# Patient Record
Sex: Female | Born: 1960 | Race: White | Hispanic: No | Marital: Married | Smoking: Former smoker
Health system: Southern US, Community
[De-identification: ages and names within clinical notes are randomized; demographics above are authoritative.]

## PROBLEM LIST (undated history)

## (undated) DIAGNOSIS — F419 Anxiety disorder, unspecified: Secondary | ICD-10-CM

## (undated) HISTORY — PX: TONSILLECTOMY: SUR1361

## (undated) HISTORY — PX: DILATION AND CURETTAGE OF UTERUS: SHX78

---

## 2011-01-11 LAB — HM COLONOSCOPY

## 2013-11-11 ENCOUNTER — Other Ambulatory Visit: Payer: Self-pay

## 2013-11-11 DIAGNOSIS — Z1231 Encounter for screening mammogram for malignant neoplasm of breast: Secondary | ICD-10-CM

## 2013-11-13 ENCOUNTER — Ambulatory Visit: Payer: Self-pay

## 2013-11-13 ENCOUNTER — Ambulatory Visit
Admission: RE | Admit: 2013-11-13 | Discharge: 2013-11-13 | Disposition: A | Discharge status: Home / Self Care | Payer: Managed Care, Other (non HMO) | Source: Ambulatory Visit

## 2013-11-13 DIAGNOSIS — Z1231 Encounter for screening mammogram for malignant neoplasm of breast: Secondary | ICD-10-CM

## 2013-11-17 ENCOUNTER — Ambulatory Visit: Payer: Self-pay

## 2013-11-24 ENCOUNTER — Other Ambulatory Visit: Payer: Self-pay | Admitting: Family Medicine

## 2013-11-24 DIAGNOSIS — R928 Other abnormal and inconclusive findings on diagnostic imaging of breast: Secondary | ICD-10-CM

## 2013-12-03 ENCOUNTER — Ambulatory Visit
Admission: RE | Admit: 2013-12-03 | Discharge: 2013-12-03 | Disposition: A | Discharge status: Home / Self Care | Payer: Managed Care, Other (non HMO) | Source: Ambulatory Visit | Attending: Family Medicine | Admitting: Family Medicine

## 2013-12-03 DIAGNOSIS — R928 Other abnormal and inconclusive findings on diagnostic imaging of breast: Secondary | ICD-10-CM

## 2015-07-20 ENCOUNTER — Other Ambulatory Visit (HOSPITAL_COMMUNITY)
Admission: RE | Admit: 2015-07-20 | Discharge: 2015-07-20 | Disposition: A | Discharge status: Home / Self Care | Payer: BC Managed Care – PPO | Source: Ambulatory Visit | Attending: Family Medicine | Admitting: Family Medicine

## 2015-07-20 ENCOUNTER — Other Ambulatory Visit: Payer: Self-pay | Admitting: Family Medicine

## 2015-07-20 DIAGNOSIS — Z124 Encounter for screening for malignant neoplasm of cervix: Secondary | ICD-10-CM | POA: Insufficient documentation

## 2015-07-20 LAB — LIPID PANEL
Cholesterol: 206 — AB (ref 0–200)
HDL: 66 (ref 35–70)
LDL CALC: 126
LDL/HDL RATIO: 3.1
TRIGLYCERIDES: 70 (ref 40–160)

## 2015-07-20 LAB — HEMOGLOBIN A1C: Hemoglobin A1C: 5.6

## 2015-07-20 LAB — BASIC METABOLIC PANEL: GLUCOSE: 92

## 2015-07-22 LAB — CYTOLOGY - PAP

## 2015-08-11 ENCOUNTER — Other Ambulatory Visit: Payer: Self-pay

## 2015-08-11 DIAGNOSIS — Z1231 Encounter for screening mammogram for malignant neoplasm of breast: Secondary | ICD-10-CM

## 2015-11-08 ENCOUNTER — Ambulatory Visit: Payer: BC Managed Care – PPO

## 2015-11-09 ENCOUNTER — Ambulatory Visit
Admission: RE | Admit: 2015-11-09 | Discharge: 2015-11-09 | Disposition: A | Discharge status: Home / Self Care | Payer: BC Managed Care – PPO | Source: Ambulatory Visit

## 2015-11-09 DIAGNOSIS — Z1231 Encounter for screening mammogram for malignant neoplasm of breast: Secondary | ICD-10-CM

## 2015-11-14 ENCOUNTER — Other Ambulatory Visit: Payer: Self-pay | Admitting: Family Medicine

## 2015-11-14 DIAGNOSIS — R928 Other abnormal and inconclusive findings on diagnostic imaging of breast: Secondary | ICD-10-CM

## 2015-11-25 ENCOUNTER — Ambulatory Visit
Admission: RE | Admit: 2015-11-25 | Discharge: 2015-11-25 | Disposition: A | Discharge status: Home / Self Care | Payer: BC Managed Care – PPO | Source: Ambulatory Visit | Attending: Family Medicine | Admitting: Family Medicine

## 2015-11-25 DIAGNOSIS — R928 Other abnormal and inconclusive findings on diagnostic imaging of breast: Secondary | ICD-10-CM

## 2016-10-15 ENCOUNTER — Encounter (HOSPITAL_COMMUNITY): Payer: Self-pay | Admitting: *Deleted

## 2016-10-15 ENCOUNTER — Inpatient Hospital Stay (HOSPITAL_COMMUNITY)
Admission: AD | Admit: 2016-10-15 | Discharge: 2016-10-15 | Disposition: A | Discharge status: Home / Self Care | Payer: BC Managed Care – PPO | Source: Ambulatory Visit | Attending: Obstetrics and Gynecology | Admitting: Obstetrics and Gynecology

## 2016-10-15 DIAGNOSIS — N888 Other specified noninflammatory disorders of cervix uteri: Secondary | ICD-10-CM | POA: Diagnosis not present

## 2016-10-15 DIAGNOSIS — Z880 Allergy status to penicillin: Secondary | ICD-10-CM | POA: Diagnosis not present

## 2016-10-15 DIAGNOSIS — N99820 Postprocedural hemorrhage and hematoma of a genitourinary system organ or structure following a genitourinary system procedure: Secondary | ICD-10-CM | POA: Diagnosis present

## 2016-10-15 LAB — CBC
HCT: 38.3 % (ref 36.0–46.0)
Hemoglobin: 13.2 g/dL (ref 12.0–15.0)
MCH: 30.1 pg (ref 26.0–34.0)
MCHC: 34.5 g/dL (ref 30.0–36.0)
MCV: 87.2 fL (ref 78.0–100.0)
PLATELETS: 236 10*3/uL (ref 150–400)
RBC: 4.39 MIL/uL (ref 3.87–5.11)
RDW: 12.8 % (ref 11.5–15.5)
WBC: 6.2 10*3/uL (ref 4.0–10.5)

## 2016-10-15 LAB — TYPE AND SCREEN
ABO/RH(D): A POS
Antibody Screen: NEGATIVE

## 2016-10-15 LAB — ABO/RH: ABO/RH(D): A POS

## 2016-10-15 MED ORDER — MEGESTROL ACETATE 40 MG PO TABS
80.0000 mg | ORAL_TABLET | Freq: Once | ORAL | Status: AC
  Administered 2016-10-15: 80 mg via ORAL
  Filled 2016-10-15: qty 2

## 2016-10-15 MED ORDER — MEGESTROL ACETATE 40 MG PO TABS: 80.0000 mg | ORAL_TABLET | Freq: Two times a day (BID) | ORAL | 1 refills | Status: DC

## 2016-10-15 NOTE — MAU Note (Signed)
Pt here with heavy vaginal bleeding after D&C on 7/10.

## 2016-10-15 NOTE — Discharge Instructions (Signed)
Abnormal Uterine Bleeding Abnormal uterine bleeding can affect women at various stages in life, including teenagers, women in their reproductive years, pregnant women, and women who have reached menopause. Several kinds of uterine bleeding are considered abnormal, including:  Bleeding or spotting between periods.  Bleeding after sexual intercourse.  Bleeding that is heavier or more than normal.  Periods that last longer than usual.  Bleeding after menopause. Many cases of abnormal uterine bleeding are minor and simple to treat, while others are more serious. Any type of abnormal bleeding should be evaluated by your health care provider. Treatment will depend on the cause of the bleeding. Follow these instructions at home: Monitor your condition for any changes. The following actions may help to alleviate any discomfort you are experiencing:  Avoid the use of tampons and douches as directed by your health care provider.  Change your pads frequently. You should get regular pelvic exams and Pap tests. Keep all follow-up appointments for diagnostic tests as directed by your health care provider. Contact a health care provider if:  Your bleeding lasts more than 1 week.  You feel dizzy at times. Get help right away if:  You pass out.  You are changing pads every 15 to 30 minutes.  You have abdominal pain.  You have a fever.  You become sweaty or weak.  You are passing large blood clots from the vagina.  You start to feel nauseous and vomit. This information is not intended to replace advice given to you by your health care provider. Make sure you discuss any questions you have with your health care provider. Document Released: 03/19/2005 Document Revised: 08/31/2015 Document Reviewed: 10/16/2012 Elsevier Interactive Patient Education  2017 [employer]vier Inc.  

## 2016-10-15 NOTE — MAU Provider Note (Signed)
Chief Complaint: Vaginal Bleeding   First Provider Initiated Contact with Patient 10/15/16 0731     SUBJECTIVE HPI: Alexandra Bridges is a 56 y.o. non-pregnant female 6 days post-op D&C for polyps 10/09/16 who presents to [facility] reporting Increasing bleeding cine the procedure that because heavy this morning soaking a large overnight pad upon standing up.   Associated signs and symptoms: Neg for fever, chills abd pain, dizziness.   Taking Ibuprofen.   No past medical history on file. OB History  No data available   Past Surgical History:  Procedure Laterality Date  . TONSILLECTOMY     Social History   Social History  . Marital status: Married    Spouse name: N/A  . Number of children: N/A  . Years of education: N/A   Occupational History  . Not on file.   Social History Main Topics  . Smoking status: Not on file  . Smokeless tobacco: Not on file  . Alcohol use Not on file  . Drug use: Unknown  . Sexual activity: Not on file   Other Topics Concern  . Not on file   Social History Narrative  . No narrative on file   Family History  Problem Relation Age of Onset  . Cancer Mother   . Hypertension Mother   . Cancer Sister   . Hypertension Sister   . Cancer Brother   . Hypertension Brother    No current facility-administered medications on file prior to encounter.    No current outpatient prescriptions on file prior to encounter.   Allergies  Allergen Reactions  . Penicillins Rash    I have reviewed patient's Past Medical Hx, Surgical Hx, Family Hx, Social Hx, medications and allergies.   Review of Systems  Constitutional: Negative for chills and fever.  Gastrointestinal: Negative for abdominal pain, diarrhea and vomiting.  Genitourinary: Positive for vaginal bleeding. Negative for hematuria and vaginal pain.  Neurological: Negative for dizziness.    OBJECTIVE Patient Vitals for the past 24 hrs:  BP Temp Temp src Pulse Resp SpO2 Height Weight   10/15/16 0648 - - - - - - 5\' 7"  (1.702 m) -  10/15/16 1540 (!) 148/67 98.3 F (36.8 C) Oral 77 18 99 % 5\' 7"  (1.702 m) 163 lb (73.9 kg)   Constitutional: Well-developed, well-nourished female in no acute distress.  Skin: No Pallor Cardiovascular: normal rate Respiratory: normal rate and effort.  GI: Abd soft, non-tender Neurologic: Alert and oriented x 4.  GU:  SPECULUM EXAM: NEFG, moderate amount of bright red blood noted--enough to soak 4 Fox swabs. Cervix closed, oozing from anterior lip from ? Tenaculum sight. Scant oozing from os. Cervical bleeding stopped w/ Silver nitrate.   BIMANUAL: cervix closed; uterus normal size, no adnexal tenderness or masses. No CMT.  LAB RESULTS Results for orders placed or performed during the hospital encounter of 10/15/16 (from the past 24 hour(s))  CBC     Status: None   Collection Time: 10/15/16  7:08 AM  Result Value Ref Range   WBC 6.2 4.0 - 10.5 K/uL   RBC 4.39 3.87 - 5.11 MIL/uL   Hemoglobin 13.2 12.0 - 15.0 g/dL   HCT 38.3 36.0 - 46.0 %   MCV 87.2 78.0 - 100.0 fL   MCH 30.1 26.0 - 34.0 pg   MCHC 34.5 30.0 - 36.0 g/dL   RDW 12.8 11.5 - 15.5 %   Platelets 236 150 - 400 K/uL  Type and screen     Status:  None   Collection Time: 10/15/16  7:16 AM  Result Value Ref Range   ABO/RH(D) A POS    Antibody Screen NEG    Sample Expiration 10/18/2016     IMAGING No results found.  MAU COURSE Orders Placed This Encounter  Procedures  . CBC  . Type and screen  . ABO/Rh  . Discharge patient   Small amount of active bleeding w/ ambulation.    Meds ordered this encounter  Medications  . megestrol (MEGACE) tablet 80 mg  . megestrol (MEGACE) 40 MG tablet    Sig: Take 2 tablets (80 mg total) by mouth 2 (two) times daily.    Dispense:  60 tablet    Refill:  1    Order Specific Question:   Supervising Provider    Answer:   Florian Buff [[id number]]    MDM Discussed Hx, labs, exam w/ Dr. Julien Girt. Agrees w/ POC. New orders: Megace 80 mg BID.  First dose in MAU. Call office to see Dr. Julien Girt in am.   ASSESSMENT 1. Postoperative vaginal bleeding following genitourinary procedure   2. Bleeding of cervix     PLAN Discharge home in stable condition. Bleeding precautions Follow-up Information    Marylynn Pearson, MD Follow up.   Specialty:  Obstetrics and Gynecology Why:  Call office tomorrow to scheduled appointment  Contact information: [address], [address] [place] [address] [phone]        [facility] ADMISS[facility] .   Contact information: [address] [id number] [place] [phone]         Allergies as of 10/15/2016      Reactions   Penicillins Rash      Medication List    TAKE these medications   megestrol 40 MG tablet Commonly known as:  MEGACE Take 2 tablets (80 mg total) by mouth 2 (two) times daily.        Tamala Julian, [place], [place] 10/15/2016  8:37 AM

## 2016-10-16 ENCOUNTER — Other Ambulatory Visit: Payer: Self-pay | Admitting: Obstetrics and Gynecology

## 2016-10-16 DIAGNOSIS — Z1231 Encounter for screening mammogram for malignant neoplasm of breast: Secondary | ICD-10-CM

## 2016-10-18 ENCOUNTER — Encounter: Payer: Self-pay | Admitting: Gynecologic Oncology

## 2016-10-19 ENCOUNTER — Telehealth: Payer: Self-pay | Admitting: Gynecologic Oncology

## 2016-10-19 NOTE — Telephone Encounter (Signed)
Pt rescheduled appt with Dr. Alycia Rossetti to 7/25 at 145pm.

## 2016-10-24 ENCOUNTER — Encounter: Payer: Self-pay | Admitting: Gynecologic Oncology

## 2016-10-24 ENCOUNTER — Ambulatory Visit: Payer: BC Managed Care – PPO | Admitting: Gynecologic Oncology

## 2016-10-24 ENCOUNTER — Ambulatory Visit: Payer: BC Managed Care – PPO | Attending: Gynecologic Oncology | Admitting: Gynecologic Oncology

## 2016-10-24 DIAGNOSIS — N840 Polyp of corpus uteri: Secondary | ICD-10-CM | POA: Insufficient documentation

## 2016-10-24 DIAGNOSIS — Z8049 Family history of malignant neoplasm of other genital organs: Secondary | ICD-10-CM | POA: Insufficient documentation

## 2016-10-24 DIAGNOSIS — N8502 Endometrial intraepithelial neoplasia [EIN]: Secondary | ICD-10-CM | POA: Diagnosis not present

## 2016-10-24 NOTE — Progress Notes (Signed)
Consult Note: Gyn-Onc  Alexandra Bridges 56 y.o. female  CC:  Chief Complaint  Patient presents with  . Complex endometrial hyperplasia with atypia    HPI: Patient is seen today in consultation at the request of Dr. Gretchen Adkins. Primary care physician Dr. Elaine Griffith.  Patient is a 56-year-old gravida 3 para 3 who was seen by Dr. Adkins and underwent an ultrasound on 08/08/2016. It revealed the uterus to be 10.7 x 5.3 x 6.8 cm normal appearing adnexa. However, there was a thickened inhomogeneous myometrium with no definitive fibroids they raise the question of possible adenomyosis. There is a possible density seen within the endometrial cavity. She underwent a post saline ultrasound revealed a 19 x 9 mm polyp appearing mass seen on the posterior endometrial wall. On 10/09/2016 she underwent a hysteroscopy D&C. 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 [facility] of heavy bleeding. She was placed on Megace and subsequently has been referred to us.  Her bleeding has improved on the Megace. Her cycles prior to this were very regular. She moved from [place] and saw Dr. Adkins is a new patient in May. These bleeding irregularities started in January. She did have an FSH, which is consistent with her being premenopausal. She's had 3 vaginal deliveries. Her mammogram is due in August. She had a colonoscopy gauge of 50 with follow-up in 10 years. Should a son who had a colonoscopy is 20 and had many polyps removed. Sig allergy to penicillin however she does not believe she had a penicillin allergy. She was taking penicillin developed a rash she was also having her kitchen remodeled and believes that most of that secondary to environmental exposures are not penicillin itself.  Review of Systems: Constitutional: Denies fever. Skin: No rash Cardiovascular: No chest pain,  shortness of breath, or edema  Gastro Intestinal: No nausea, vomiting, constipation, or diarrhea reported.  Genitourinary: No frequency, urgency, or dysuria. +vaginal bleeding and discharge.  Psychology: No complaints. Is somewhat worried about the timing of all this. She is a 3-year-old granddaughter and is expecting a new grandson on August 12. She also has a trip to [place] plan for September 10  Current Meds:  Outpatient Encounter Prescriptions as of 10/24/2016  Medication Sig  . cetirizine (ZYRTEC) 10 MG tablet Take 10 mg by mouth daily as needed for allergies.  . megestrol (MEGACE) 40 MG tablet Take 2 tablets (80 mg total) by mouth 2 (two) times daily.   No facility-administered encounter medications on file as of 10/24/2016.     Allergy:  Allergies  Allergen Reactions  . Penicillins Rash    Social Hx:   Social History   Social History  . Marital status: Married    Spouse name: N/A  . Number of children: N/A  . Years of education: N/A   Occupational History  . Not on file.   Social History Main Topics  . Smoking status: Never Smoker  . Smokeless tobacco: Never Used  . Alcohol use 1.2 oz/week    2 Glasses of wine per week  . Drug use: No  . Sexual activity: Yes   Other Topics Concern  . Not on file   Social History Narrative  . No narrative on file    Past Surgical Hx:  Past Surgical History:  Procedure Laterality Date  . TONSILLECTOMY      Past Medical Hx: History reviewed. No pertinent   past medical history.  Oncology Hx:   No history exists.    Family Hx:  Family History  Problem Relation Age of Onset  . Hypertension Mother   . Cancer Father        multiple myeloma  . Cancer Sister 64       endometrial and renal cell  . Hypertension Sister   . Cancer Brother 55       prostate  . Hypertension Brother   . Cancer Maternal Grandmother        pancreatic  . Cancer Maternal Grandfather        esophageal    Vitals:  Blood pressure (!) 113/54,  pulse 85, temperature 98.5 F (36.9 C), temperature source Oral, resp. rate 18, height 5' 7" (1.702 m), weight 164 lb 1.6 oz (74.4 kg), SpO2 100 %.  Physical Exam:  Well-nourished well-developed female in no acute distress.  Neck: Supple, no lymphadenopathy, no thyromegaly.  Lungs: Clear to auscultation bilaterally  Cardiac: Regular rate and rhythm  Abdomen: Soft, nontender, nondistended. There are no palpable mass or prostatomegaly.  Groins: No lymphadenopathy.  Extremities: No edema  Pelvic: External genitalia within normal limits. Vagina is slightly atrophic. There is evidence of menstrual flow. The cervix is multiparous. There are no gross visible lesions. Bimanual examination the uterus is mid plane. It is of normal size shape and consistency. There are no adnexal masses. It is freely mobile.  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 A detailed discussion was held with the patient and her husband with regard to to her diagnosis. We discussed the standard management options for CAH which includes surgery and depending on the results of surgery adjuvant therapy if there is a deeply invasive endometrial cancer. We discussed that while she has complex atypical hyperplasia and we currently do not know if she carries a diagnosis and surgery will be indicated to both the diagnosis and treat the issue.  The options for surgical management include a hysterectomy and removal of the tubes and ovaries possibly with removal of sentinel lymph nodes. A minimally invasive approach including a robotic hysterectomy or laparoscopic hysterectomy have benefits including shorter hospital stay, recovery time and better wound healing. The patient has been counseled about these surgical options and the risks of surgery in general including infection, bleeding, damage to surrounding structures (including bowel, bladder, ureters, nerves or vessels), and the postoperative risks of PE/  DVT, and lymphedema. I discussed positioning during surgery of trendelenberg and risks of minor facial swelling and care we take in preoperative positioning. After counseling and consideration of her options, she desires to proceed with robotic hysterectomy bilateral salpingo-oophorectomy and sentinel lymph node dissection as indicated.  She understands that if she does not map on her sentinel lymph node will proceed with a hysterectomy and sent it for frozen section. Only if there is deeply invasive cancer within proceed with a complete lymphadenectomy. Her sister had endometrial cancer and had 20 lymph nodes removed so she is familiar with this as well as the neuropathy in the genitofemoral region that her sister also did experience postoperatively.  Her family history is fairly worrisome. Her sister is going to undergo testing for Lynch syndrome. I discussed with the patient that if she has endometrial cancer on final pathology that I would recommend she undergo Lynch testing as well. However, if she does not have endometrial cancer I would test her tumor for mismatch repair proteins and MSI. That would then dictate the need for her to undergo   genetic testing. Obviously, if her sister is positive for Lynch syndrome the patient will undergo testing for the same mutation that her sister exhibits. This was explained in detail to the patient and her husband and they're very comfortable with this staged approach to genetic testing as they are with a staged approach 2 sentinel lymph nodes and only complete lymphadenectomy should frozen section revealed deeply invasive cancer.  The patient does have a grandchild that is expected on August 12 as well as a trip to [place] for September 10. She would like to schedule surgery for August 7 with Dr. Everitt Amber. She states that she does not have to be present for the birth of her grandchild that she would like to make the trip to [place] as planned with her friends.  I discussed with her that if we are able to accomplish in a minimally invasive fashion I feel that she would most likely be able to make this trip and I would recommend that she take a full aspirin during her long flights and for a few days after to decrease the risk of VTE. She will continue her Megace until the time of surgery to decrease the risk of heavy bleeding.  We appreciate the opportunity to partner in the care of this very pleasant patient.  GEHRIG,PAOLA A., MD 10/24/2016, 2:48 PM

## 2016-10-24 NOTE — Patient Instructions (Signed)
Preparing for your Surgery  Plan for surgery on November 06, 2016 with Dr. Everitt Amber at [facility] will be scheduled for a robotic assisted total hysterectomy, bilateral salpingo-oophorectomy, sentinel lymph node biopsy.  Pre-operative Testing -You will receive a phone call from presurgical testing at [facility] to arrange for a pre-operative testing appointment before your surgery.  This appointment normally occurs one to two weeks before your scheduled surgery.   -Bring your insurance card, copy of an advanced directive if applicable, medication list  -At that visit, you will be asked to sign a consent for a possible blood transfusion in case a transfusion becomes necessary during surgery.  The need for a blood transfusion is rare but having consent is a necessary part of your care.     -You should not be taking blood thinners or aspirin at least ten days prior to surgery unless instructed by your surgeon.  Day Before Surgery at [place] will be asked to take in a light diet the day before surgery.  Avoid carbonated beverages.  You will be advised to have nothing to eat or drink after midnight the evening before.     Eat a light diet the day before surgery.  Examples including soups, broths, toast, yogurt, mashed potatoes.  Things to avoid include carbonated beverages (fizzy beverages), raw fruits and raw vegetables, or beans.    If your bowels are filled with gas, your surgeon will have difficulty visualizing your pelvic organs which increases your surgical risks.  Your role in recovery Your role is to become active as soon as directed by your doctor, while still giving yourself time to heal.  Rest when you feel tired. You will be asked to do the following in order to speed your recovery:  - Cough and breathe deeply. This helps toclear and expand your lungs and can prevent pneumonia. You may be given a spirometer to practice deep breathing. A staff  member will show you how to use the spirometer. - Do mild physical activity. Walking or moving your legs help your circulation and body functions return to normal. A staff member will help you when you try to walk and will provide you with simple exercises. Do not try to get up or walk alone the first time. - Actively manage your pain. Managing your pain lets you move in comfort. We will ask you to rate your pain on a scale of zero to 10. It is your responsibility to tell your doctor or nurse where and how much you hurt so your pain can be treated.  Special Considerations -If you are diabetic, you may be placed on insulin after surgery to have closer control over your blood sugars to promote healing and recovery.  This does not mean that you will be discharged on insulin.  If applicable, your oral antidiabetics will be resumed when you are tolerating a solid diet.  -Your final pathology results from surgery should be available by the Friday after surgery and the results will be relayed to you when available.   Blood Transfusion Information WHAT IS A BLOOD TRANSFUSION? A transfusion is the replacement of blood or some of its parts. Blood is made up of multiple cells which provide different functions.  Red blood cells carry oxygen and are used for blood loss replacement.  White blood cells fight against infection.  Platelets control bleeding.  Plasma helps clot blood.  Other blood products are available for specialized needs, such as hemophilia  or other clotting disorders. BEFORE THE TRANSFUSION  Who gives blood for transfusions?   You may be able to donate blood to be used at a later date on yourself (autologous donation).  Relatives can be asked to donate blood. This is generally not any safer than if you have received blood from a stranger. The same precautions are taken to ensure safety when a relative's blood is donated.  Healthy volunteers who are fully evaluated to make sure their  blood is safe. This is blood bank blood. Transfusion therapy is the safest it has ever been in the practice of medicine. Before blood is taken from a donor, a complete history is taken to make sure that person has no history of diseases nor engages in risky social behavior (examples are intravenous drug use or sexual activity with multiple partners). The donor's travel history is screened to minimize risk of transmitting infections, such as malaria. The donated blood is tested for signs of infectious diseases, such as HIV and hepatitis. The blood is then tested to be sure it is compatible with you in order to minimize the chance of a transfusion reaction. If you or a relative donates blood, this is often done in anticipation of surgery and is not appropriate for emergency situations. It takes many days to process the donated blood. RISKS AND COMPLICATIONS Although transfusion therapy is very safe and saves many lives, the main dangers of transfusion include:   Getting an infectious disease.  Developing a transfusion reaction. This is an allergic reaction to something in the blood you were given. Every precaution is taken to prevent this. The decision to have a blood transfusion has been considered carefully by your caregiver before blood is given. Blood is not given unless the benefits outweigh the risks.

## 2016-10-30 NOTE — Patient Instructions (Signed)
Alexandra Bridges  10/30/2016   Your procedure is scheduled on: 11-06-16   Report to [facility] Main  Entrance Take [place]  Elevators to 3rd floor to [place] at 11:00 AM.    Call this number if you have problems the morning of surgery [phone]     Remember: ONLY 1 PERSON MAY GO WITH YOU TO SHORT STAY TO GET  READY MORNING OF [place].  Do not eat food or drink liquids :After Midnight.    Eat a light diet the day before surgery.  Examples including soups, broths, toast, yogurt, mashed potatoes.  Things to avoid include carbonated beverages  (fizzy beverages), raw fruits and raw vegetables, or beans.    If your bowels are filled with gas, your surgeon will have difficulty visualizing your pelvic organs which increases your surgical risks.  Take these medicines the morning of surgery with A SIP OF WATER: None                                You may not have any metal on your body including hair pins and              piercings  Do not wear jewelry, make-up, lotions, powders or perfumes, deodorant             Do not wear nail polish.  Do not shave  48 hours prior to surgery.        Do not bring valuables to the hospital. [place].  Contacts, dentures or bridgework may not be worn into surgery.  Leave suitcase in the car. After surgery it may be brought to your room.     Please read over the following fact sheets you were given: _____________________________________________________________________             [facility] - Preparing for Surgery Before surgery, you can play an important role.  Because skin is not sterile, your skin needs to be as free of germs as possible.  You can reduce the number of germs on your skin by washing with CHG (chlorahexidine gluconate) soap before surgery.  CHG is an antiseptic cleaner which kills germs and bonds with the skin to continue killing germs even after  washing. Please DO NOT use if you have an allergy to CHG or antibacterial soaps.  If your skin becomes reddened/irritated stop using the CHG and inform your nurse when you arrive at Short Stay. Do not shave (including legs and underarms) for at least 48 hours prior to the first CHG shower.  You may shave your face/neck. Please follow these instructions carefully:  1.  Shower with CHG Soap the night before surgery and the  morning of Surgery.  2.  If you choose to wash your hair, wash your hair first as usual with your  normal  shampoo.  3.  After you shampoo, rinse your hair and body thoroughly to remove the  shampoo.                           4.  Use CHG as you would any other liquid soap.  You can apply chg directly  to the skin and wash  Gently with a scrungie or clean washcloth.  5.  Apply the CHG Soap to your body ONLY FROM THE NECK DOWN.   Do not use on face/ open                           Wound or open sores. Avoid contact with eyes, ears mouth and genitals (private parts).                       Wash face,  Genitals (private parts) with your normal soap.             6.  Wash thoroughly, paying special attention to the area where your surgery  will be performed.  7.  Thoroughly rinse your body with warm water from the neck down.  8.  DO NOT shower/wash with your normal soap after using and rinsing off  the CHG Soap.                9.  Pat yourself dry with a clean towel.            10.  Wear clean pajamas.            11.  Place clean sheets on your bed the night of your first shower and do not  sleep with pets. Day of Surgery : Do not apply any lotions/deodorants the morning of surgery.  Please wear clean clothes to the hospital/surgery center.  FAILURE TO FOLLOW THESE INSTRUCTIONS MAY RESULT IN THE CANCELLATION OF YOUR SURGERY PATIENT SIGNATURE_________________________________  NURSE  SIGNATURE__________________________________  ________________________________________________________________________   Adam Phenix  An incentive spirometer is a tool that can help keep your lungs clear and active. This tool measures how well you are filling your lungs with each breath. Taking long deep breaths may help reverse or decrease the chance of developing breathing (pulmonary) problems (especially infection) following:  A long period of time when you are unable to move or be active. BEFORE THE PROCEDURE   If the spirometer includes an indicator to show your best effort, your nurse or respiratory therapist will set it to a desired goal.  If possible, sit up straight or lean slightly forward. Try not to slouch.  Hold the incentive spirometer in an upright position. INSTRUCTIONS FOR USE  1. Sit on the edge of your bed if possible, or sit up as far as you can in bed or on a chair. 2. Hold the incentive spirometer in an upright position. 3. Breathe out normally. 4. Place the mouthpiece in your mouth and seal your lips tightly around it. 5. Breathe in slowly and as deeply as possible, raising the piston or the ball toward the top of the column. 6. Hold your breath for 3-5 seconds or for as long as possible. Allow the piston or ball to fall to the bottom of the column. 7. Remove the mouthpiece from your mouth and breathe out normally. 8. Rest for a few seconds and repeat Steps 1 through 7 at least 10 times every 1-2 hours when you are awake. Take your time and take a few normal breaths between deep breaths. 9. The spirometer may include an indicator to show your best effort. Use the indicator as a goal to work toward during each repetition. 10. After each set of 10 deep breaths, practice coughing to be sure your lungs are clear. If you have an incision (the cut made at the time of surgery),  support your incision when coughing by placing a pillow or rolled up towels firmly  against it. Once you are able to get out of bed, walk around indoors and cough well. You may stop using the incentive spirometer when instructed by your caregiver.  RISKS AND COMPLICATIONS  Take your time so you do not get dizzy or light-headed.  If you are in pain, you may need to take or ask for pain medication before doing incentive spirometry. It is harder to take a deep breath if you are having pain. AFTER USE  Rest and breathe slowly and easily.  It can be helpful to keep track of a log of your progress. Your caregiver can provide you with a simple table to help with this. If you are using the spirometer at home, follow these instructions: [place] IF:   You are having difficultly using the spirometer.  You have trouble using the spirometer as often as instructed.  Your pain medication is not giving enough relief while using the spirometer.  You develop fever of 100.5 F (38.1 C) or higher. SEEK IMMEDIATE MEDICAL CARE IF:   You cough up bloody sputum that had not been present before.  You develop fever of 102 F (38.9 C) or greater.  You develop worsening pain at or near the incision site. MAKE SURE YOU:   Understand these instructions.  Will watch your condition.  Will get help right away if you are not doing well or get worse. Document Released: 07/30/2006 Document Revised: 06/11/2011 Document Reviewed: 09/30/2006 ExitCare Patient Information 2014 ExitCare, [place].   ________________________________________________________________________  WHAT IS A BLOOD TRANSFUSION? Blood Transfusion Information  A transfusion is the replacement of blood or some of its parts. Blood is made up of multiple cells which provide different functions.  Red blood cells carry oxygen and are used for blood loss replacement.  White blood cells fight against infection.  Platelets control bleeding.  Plasma helps clot blood.  Other blood products are available for  specialized needs, such as hemophilia or other clotting disorders. BEFORE THE TRANSFUSION  Who gives blood for transfusions?   Healthy volunteers who are fully evaluated to make sure their blood is safe. This is blood bank blood. Transfusion therapy is the safest it has ever been in the practice of medicine. Before blood is taken from a donor, a complete history is taken to make sure that person has no history of diseases nor engages in risky social behavior (examples are intravenous drug use or sexual activity with multiple partners). The donor's travel history is screened to minimize risk of transmitting infections, such as malaria. The donated blood is tested for signs of infectious diseases, such as HIV and hepatitis. The blood is then tested to be sure it is compatible with you in order to minimize the chance of a transfusion reaction. If you or a relative donates blood, this is often done in anticipation of surgery and is not appropriate for emergency situations. It takes many days to process the donated blood. RISKS AND COMPLICATIONS Although transfusion therapy is very safe and saves many lives, the main dangers of transfusion include:   Getting an infectious disease.  Developing a transfusion reaction. This is an allergic reaction to something in the blood you were given. Every precaution is taken to prevent this. The decision to have a blood transfusion has been considered carefully by your caregiver before blood is given. Blood is not given unless the benefits outweigh the risks. AFTER THE TRANSFUSION  Right after receiving a blood transfusion, you will usually feel much better and more energetic. This is especially true if your red blood cells have gotten low (anemic). The transfusion raises the level of the red blood cells which carry oxygen, and this usually causes an energy increase.  The nurse administering the transfusion will monitor you carefully for complications. HOME CARE  INSTRUCTIONS  No special instructions are needed after a transfusion. You may find your energy is better. Speak with your caregiver about any limitations on activity for underlying diseases you may have. SEEK MEDICAL CARE IF:   Your condition is not improving after your transfusion.  You develop redness or irritation at the intravenous (IV) site. SEEK IMMEDIATE MEDICAL CARE IF:  Any of the following symptoms occur over the next 12 hours:  Shaking chills.  You have a temperature by mouth above 102 F (38.9 C), not controlled by medicine.  Chest, back, or muscle pain.  People around you feel you are not acting correctly or are confused.  Shortness of breath or difficulty breathing.  Dizziness and fainting.  You get a rash or develop hives.  You have a decrease in urine output.  Your urine turns a dark color or changes to pink, red, or brown. Any of the following symptoms occur over the next 10 days:  You have a temperature by mouth above 102 F (38.9 C), not controlled by medicine.  Shortness of breath.  Weakness after normal activity.  The white part of the eye turns yellow (jaundice).  You have a decrease in the amount of urine or are urinating less often.  Your urine turns a dark color or changes to pink, red, or brown. Document Released: 03/16/2000 Document Revised: 06/11/2011 Document Reviewed: 11/03/2007 [facility] Patient Information 2014 [place], [place].  _______________________________________________________________________

## 2016-10-31 ENCOUNTER — Encounter (HOSPITAL_COMMUNITY)
Admission: RE | Admit: 2016-10-31 | Discharge: 2016-10-31 | Disposition: A | Discharge status: Home / Self Care | Payer: BC Managed Care – PPO | Source: Ambulatory Visit | Attending: Gynecologic Oncology | Admitting: Gynecologic Oncology

## 2016-10-31 ENCOUNTER — Encounter (HOSPITAL_COMMUNITY): Payer: Self-pay

## 2016-10-31 ENCOUNTER — Ambulatory Visit: Payer: BC Managed Care – PPO | Admitting: Gynecologic Oncology

## 2016-10-31 DIAGNOSIS — N8501 Benign endometrial hyperplasia: Secondary | ICD-10-CM | POA: Diagnosis not present

## 2016-10-31 DIAGNOSIS — Z01818 Encounter for other preprocedural examination: Secondary | ICD-10-CM | POA: Insufficient documentation

## 2016-10-31 HISTORY — DX: Anxiety disorder, unspecified: F41.9

## 2016-10-31 LAB — URINALYSIS, ROUTINE W REFLEX MICROSCOPIC
Bacteria, UA: NONE SEEN
Bilirubin Urine: NEGATIVE
GLUCOSE, UA: NEGATIVE mg/dL
KETONES UR: NEGATIVE mg/dL
LEUKOCYTES UA: NEGATIVE
Nitrite: NEGATIVE
PROTEIN: NEGATIVE mg/dL
SQUAMOUS EPITHELIAL / LPF: NONE SEEN
Specific Gravity, Urine: 1.009 (ref 1.005–1.030)
pH: 6 (ref 5.0–8.0)

## 2016-10-31 LAB — COMPREHENSIVE METABOLIC PANEL
ALBUMIN: 4.3 g/dL (ref 3.5–5.0)
ALK PHOS: 49 U/L (ref 38–126)
ALT: 13 U/L — AB (ref 14–54)
AST: 17 U/L (ref 15–41)
Anion gap: 7 (ref 5–15)
BILIRUBIN TOTAL: 0.7 mg/dL (ref 0.3–1.2)
BUN: 14 mg/dL (ref 6–20)
CO2: 25 mmol/L (ref 22–32)
CREATININE: 0.83 mg/dL (ref 0.44–1.00)
Calcium: 9.4 mg/dL (ref 8.9–10.3)
Chloride: 107 mmol/L (ref 101–111)
GFR calc Af Amer: >60 mL/min (ref >60)
GFR calc non Af Amer: >60 mL/min (ref >60)
GLUCOSE: 98 mg/dL (ref 65–99)
POTASSIUM: 5.2 mmol/L — AB (ref 3.5–5.1)
Sodium: 139 mmol/L (ref 135–145)
TOTAL PROTEIN: 6.9 g/dL (ref 6.5–8.1)

## 2016-10-31 LAB — CBC
HEMATOCRIT: 38.2 % (ref 36.0–46.0)
HEMOGLOBIN: 13.1 g/dL (ref 12.0–15.0)
MCH: 30 pg (ref 26.0–34.0)
MCHC: 34.3 g/dL (ref 30.0–36.0)
MCV: 87.4 fL (ref 78.0–100.0)
Platelets: 255 10*3/uL (ref 150–400)
RBC: 4.37 MIL/uL (ref 3.87–5.11)
RDW: 12.8 % (ref 11.5–15.5)
WBC: 6.3 10*3/uL (ref 4.0–10.5)

## 2016-10-31 LAB — ABO/RH: ABO/RH(D): A POS

## 2016-10-31 NOTE — Progress Notes (Signed)
10-31-16 UA result routed to Dr. Denman George for review.

## 2016-11-06 ENCOUNTER — Ambulatory Visit (HOSPITAL_COMMUNITY)
Admission: RE | Admit: 2016-11-06 | Discharge: 2016-11-07 | Disposition: A | Discharge status: Home / Self Care | Payer: BC Managed Care – PPO | Source: Ambulatory Visit | Attending: Gynecologic Oncology | Admitting: Gynecologic Oncology

## 2016-11-06 ENCOUNTER — Ambulatory Visit (HOSPITAL_COMMUNITY): Payer: BC Managed Care – PPO | Admitting: Anesthesiology

## 2016-11-06 ENCOUNTER — Encounter (HOSPITAL_COMMUNITY): Payer: Self-pay | Admitting: *Deleted

## 2016-11-06 ENCOUNTER — Encounter (HOSPITAL_COMMUNITY)
Admission: RE | Disposition: A | Discharge status: Home / Self Care | Payer: Self-pay | Source: Ambulatory Visit | Attending: Gynecologic Oncology

## 2016-11-06 DIAGNOSIS — D259 Leiomyoma of uterus, unspecified: Secondary | ICD-10-CM | POA: Diagnosis not present

## 2016-11-06 DIAGNOSIS — N8502 Endometrial intraepithelial neoplasia [EIN]: Secondary | ICD-10-CM | POA: Diagnosis present

## 2016-11-06 DIAGNOSIS — Z79899 Other long term (current) drug therapy: Secondary | ICD-10-CM | POA: Insufficient documentation

## 2016-11-06 HISTORY — PX: ROBOTIC ASSISTED TOTAL HYSTERECTOMY WITH BILATERAL SALPINGO OOPHERECTOMY: SHX6086

## 2016-11-06 LAB — TYPE AND SCREEN
ABO/RH(D): A POS
Antibody Screen: NEGATIVE

## 2016-11-06 SURGERY — HYSTERECTOMY, TOTAL, ROBOT-ASSISTED, LAPAROSCOPIC, WITH BILATERAL SALPINGO-OOPHORECTOMY
Anesthesia: General | Site: Abdomen | Laterality: Bilateral

## 2016-11-06 MED ORDER — ONDANSETRON HCL 4 MG PO TABS: 4.0000 mg | ORAL_TABLET | Freq: Four times a day (QID) | ORAL | Status: DC | PRN

## 2016-11-06 MED ORDER — SUGAMMADEX SODIUM 200 MG/2ML IV SOLN
INTRAVENOUS | Status: AC
  Filled 2016-11-06: qty 2

## 2016-11-06 MED ORDER — ONDANSETRON HCL 4 MG/2ML IJ SOLN
INTRAMUSCULAR | Status: DC | PRN
  Administered 2016-11-06: 4 mg via INTRAVENOUS

## 2016-11-06 MED ORDER — MIDAZOLAM HCL 5 MG/5ML IJ SOLN
INTRAMUSCULAR | Status: DC | PRN
  Administered 2016-11-06: 2 mg via INTRAVENOUS

## 2016-11-06 MED ORDER — DEXAMETHASONE SODIUM PHOSPHATE 10 MG/ML IJ SOLN
INTRAMUSCULAR | Status: AC
  Filled 2016-11-06: qty 1

## 2016-11-06 MED ORDER — LACTATED RINGERS IR SOLN
Status: DC | PRN
  Administered 2016-11-06: 1000 mL

## 2016-11-06 MED ORDER — MIDAZOLAM HCL 2 MG/2ML IJ SOLN
INTRAMUSCULAR | Status: AC
  Filled 2016-11-06: qty 2

## 2016-11-06 MED ORDER — CIPROFLOXACIN IN D5W 400 MG/200ML IV SOLN
400.0000 mg | INTRAVENOUS | Status: AC
  Administered 2016-11-06: 400 mg via INTRAVENOUS
  Filled 2016-11-06: qty 200

## 2016-11-06 MED ORDER — KETAMINE HCL 10 MG/ML IJ SOLN
INTRAMUSCULAR | Status: DC | PRN
  Administered 2016-11-06: 50 mg via INTRAVENOUS

## 2016-11-06 MED ORDER — STERILE WATER FOR INJECTION IJ SOLN
INTRAMUSCULAR | Status: DC | PRN
  Administered 2016-11-06: 10 mL

## 2016-11-06 MED ORDER — HYDROMORPHONE HCL-NACL 0.5-0.9 MG/ML-% IV SOSY
0.2000 mg | PREFILLED_SYRINGE | INTRAVENOUS | Status: DC | PRN
  Administered 2016-11-06 – 2016-11-07 (×3): 0.5 mg via INTRAVENOUS
  Filled 2016-11-06 (×3): qty 1

## 2016-11-06 MED ORDER — GABAPENTIN 300 MG PO CAPS
600.0000 mg | ORAL_CAPSULE | Freq: Every day | ORAL | Status: AC
  Administered 2016-11-06: 600 mg via ORAL
  Filled 2016-11-06: qty 2

## 2016-11-06 MED ORDER — KCL IN DEXTROSE-NACL 20-5-0.45 MEQ/L-%-% IV SOLN
INTRAVENOUS | Status: DC
  Administered 2016-11-06: 19:00:00 via INTRAVENOUS
  Filled 2016-11-06 (×2): qty 1000

## 2016-11-06 MED ORDER — FENTANYL CITRATE (PF) 250 MCG/5ML IJ SOLN
INTRAMUSCULAR | Status: AC
  Filled 2016-11-06: qty 5

## 2016-11-06 MED ORDER — PROPOFOL 10 MG/ML IV BOLUS
INTRAVENOUS | Status: DC | PRN
  Administered 2016-11-06: 200 mg via INTRAVENOUS

## 2016-11-06 MED ORDER — LIDOCAINE 2% (20 MG/ML) 5 ML SYRINGE
INTRAMUSCULAR | Status: AC
  Filled 2016-11-06: qty 5

## 2016-11-06 MED ORDER — SCOPOLAMINE 1 MG/3DAYS TD PT72
MEDICATED_PATCH | TRANSDERMAL | Status: AC
  Filled 2016-11-06: qty 1

## 2016-11-06 MED ORDER — ONDANSETRON HCL 4 MG/2ML IJ SOLN
INTRAMUSCULAR | Status: AC
  Filled 2016-11-06: qty 2

## 2016-11-06 MED ORDER — FENTANYL CITRATE (PF) 100 MCG/2ML IJ SOLN
25.0000 ug | INTRAMUSCULAR | Status: DC | PRN
  Administered 2016-11-06 (×2): 25 ug via INTRAVENOUS
  Administered 2016-11-06: 50 ug via INTRAVENOUS

## 2016-11-06 MED ORDER — ONDANSETRON HCL 4 MG/2ML IJ SOLN: 4.0000 mg | Freq: Four times a day (QID) | INTRAMUSCULAR | Status: DC | PRN

## 2016-11-06 MED ORDER — ROCURONIUM BROMIDE 50 MG/5ML IV SOSY
PREFILLED_SYRINGE | INTRAVENOUS | Status: AC
  Filled 2016-11-06: qty 5

## 2016-11-06 MED ORDER — OXYCODONE-ACETAMINOPHEN 5-325 MG PO TABS
1.0000 | ORAL_TABLET | ORAL | Status: DC | PRN
  Administered 2016-11-07: 1 via ORAL
  Filled 2016-11-06: qty 1

## 2016-11-06 MED ORDER — KETAMINE HCL 10 MG/ML IJ SOLN
INTRAMUSCULAR | Status: AC
  Filled 2016-11-06: qty 1

## 2016-11-06 MED ORDER — EPHEDRINE SULFATE 50 MG/ML IJ SOLN
INTRAMUSCULAR | Status: DC | PRN
  Administered 2016-11-06: 10 mg via INTRAVENOUS

## 2016-11-06 MED ORDER — FENTANYL CITRATE (PF) 100 MCG/2ML IJ SOLN
INTRAMUSCULAR | Status: DC | PRN
  Administered 2016-11-06: 50 ug via INTRAVENOUS
  Administered 2016-11-06: 100 ug via INTRAVENOUS
  Administered 2016-11-06 (×2): 50 ug via INTRAVENOUS

## 2016-11-06 MED ORDER — STERILE WATER FOR IRRIGATION IR SOLN
Status: DC | PRN
  Administered 2016-11-06: 1000 mL

## 2016-11-06 MED ORDER — STERILE WATER FOR INJECTION IJ SOLN
INTRAMUSCULAR | Status: AC
  Filled 2016-11-06: qty 10

## 2016-11-06 MED ORDER — SUGAMMADEX SODIUM 200 MG/2ML IV SOLN
INTRAVENOUS | Status: DC | PRN
  Administered 2016-11-06: 200 mg via INTRAVENOUS

## 2016-11-06 MED ORDER — LACTATED RINGERS IV SOLN
INTRAVENOUS | Status: DC
  Administered 2016-11-06 (×2): via INTRAVENOUS

## 2016-11-06 MED ORDER — SCOPOLAMINE 1 MG/3DAYS TD PT72
1.0000 | MEDICATED_PATCH | TRANSDERMAL | Status: DC
  Administered 2016-11-06: 1.5 mg via TRANSDERMAL

## 2016-11-06 MED ORDER — ENOXAPARIN SODIUM 40 MG/0.4ML ~~LOC~~ SOLN
40.0000 mg | SUBCUTANEOUS | Status: AC
  Administered 2016-11-06: 40 mg via SUBCUTANEOUS
  Filled 2016-11-06: qty 0.4

## 2016-11-06 MED ORDER — CELECOXIB 200 MG PO CAPS
200.0000 mg | ORAL_CAPSULE | Freq: Once | ORAL | Status: AC
  Administered 2016-11-06: 200 mg via ORAL

## 2016-11-06 MED ORDER — DICLOFENAC SODIUM 50 MG PO TBEC
50.0000 mg | DELAYED_RELEASE_TABLET | Freq: Four times a day (QID) | ORAL | Status: DC
  Administered 2016-11-06 – 2016-11-07 (×3): 50 mg via ORAL
  Filled 2016-11-06 (×4): qty 1

## 2016-11-06 MED ORDER — ACETAMINOPHEN 500 MG PO TABS
ORAL_TABLET | ORAL | Status: AC
  Filled 2016-11-06: qty 2

## 2016-11-06 MED ORDER — PROMETHAZINE HCL 25 MG/ML IJ SOLN: 6.2500 mg | INTRAMUSCULAR | Status: DC | PRN

## 2016-11-06 MED ORDER — CELECOXIB 200 MG PO CAPS
ORAL_CAPSULE | ORAL | Status: AC
  Filled 2016-11-06: qty 1

## 2016-11-06 MED ORDER — FENTANYL CITRATE (PF) 100 MCG/2ML IJ SOLN
INTRAMUSCULAR | Status: AC
  Filled 2016-11-06: qty 2

## 2016-11-06 MED ORDER — ROCURONIUM BROMIDE 100 MG/10ML IV SOLN
INTRAVENOUS | Status: DC | PRN
  Administered 2016-11-06: 50 mg via INTRAVENOUS
  Administered 2016-11-06: 20 mg via INTRAVENOUS

## 2016-11-06 MED ORDER — LIDOCAINE 2% (20 MG/ML) 5 ML SYRINGE
INTRAMUSCULAR | Status: AC
  Filled 2016-11-06: qty 10

## 2016-11-06 MED ORDER — ORAL CARE MOUTH RINSE
15.0000 mL | Freq: Two times a day (BID) | OROMUCOSAL | Status: DC
  Administered 2016-11-06: 15 mL via OROMUCOSAL

## 2016-11-06 MED ORDER — GABAPENTIN 300 MG PO CAPS
300.0000 mg | ORAL_CAPSULE | Freq: Once | ORAL | Status: AC
  Administered 2016-11-06: 300 mg via ORAL

## 2016-11-06 MED ORDER — GABAPENTIN 300 MG PO CAPS
ORAL_CAPSULE | ORAL | Status: AC
  Filled 2016-11-06: qty 1

## 2016-11-06 MED ORDER — LIDOCAINE 2% (20 MG/ML) 5 ML SYRINGE
INTRAMUSCULAR | Status: DC | PRN
  Administered 2016-11-06: 1.5 mg/kg/h via INTRAVENOUS

## 2016-11-06 MED ORDER — PROPOFOL 10 MG/ML IV BOLUS
INTRAVENOUS | Status: AC
  Filled 2016-11-06: qty 20

## 2016-11-06 MED ORDER — CLINDAMYCIN PHOSPHATE 900 MG/50ML IV SOLN
900.0000 mg | INTRAVENOUS | Status: AC
  Administered 2016-11-06: 900 mg via INTRAVENOUS
  Filled 2016-11-06: qty 50

## 2016-11-06 MED ORDER — SENNOSIDES-DOCUSATE SODIUM 8.6-50 MG PO TABS
2.0000 | ORAL_TABLET | Freq: Every day | ORAL | Status: DC
Start: 2016-11-06 — End: 2016-11-07
  Administered 2016-11-06: 2 via ORAL
  Filled 2016-11-06: qty 2

## 2016-11-06 MED ORDER — DEXAMETHASONE SODIUM PHOSPHATE 10 MG/ML IJ SOLN
INTRAMUSCULAR | Status: DC | PRN
  Administered 2016-11-06: 10 mg via INTRAVENOUS

## 2016-11-06 MED ORDER — ACETAMINOPHEN 500 MG PO TABS
1000.0000 mg | ORAL_TABLET | Freq: Once | ORAL | Status: AC
  Administered 2016-11-06: 1000 mg via ORAL

## 2016-11-06 SURGICAL SUPPLY — 48 items
APPLICATOR SURGIFLO ENDO (HEMOSTASIS) IMPLANT
BAG LAPAROSCOPIC 12 15 PORT 16 (BASKET) IMPLANT
BAG RETRIEVAL 12/15 (BASKET)
BAG RETRIEVAL 12/15MM (BASKET)
COVER BACK TABLE 60X90IN (DRAPES) ×3 IMPLANT
COVER TIP SHEARS 8 DVNC (MISCELLANEOUS) ×1 IMPLANT
COVER TIP SHEARS 8MM DA VINCI (MISCELLANEOUS) ×2
DRAPE ARM DVNC X/XI (DISPOSABLE) ×4 IMPLANT
DRAPE COLUMN DVNC XI (DISPOSABLE) ×1 IMPLANT
DRAPE DA VINCI XI ARM (DISPOSABLE) ×8
DRAPE DA VINCI XI COLUMN (DISPOSABLE) ×2
DRAPE SHEET LG 3/4 BI-LAMINATE (DRAPES) ×3 IMPLANT
DRAPE SURG IRRIG POUCH 19X23 (DRAPES) ×3 IMPLANT
ELECT REM PT RETURN 15FT ADLT (MISCELLANEOUS) ×3 IMPLANT
GLOVE BIO SURGEON STRL SZ 6 (GLOVE) ×12 IMPLANT
GLOVE BIO SURGEON STRL SZ 6.5 (GLOVE) ×4 IMPLANT
GLOVE BIO SURGEONS STRL SZ 6.5 (GLOVE) ×2
GOWN STRL REUS W/ TWL LRG LVL3 (GOWN DISPOSABLE) ×2 IMPLANT
GOWN STRL REUS W/TWL LRG LVL3 (GOWN DISPOSABLE) ×4
HOLDER FOLEY CATH W/STRAP (MISCELLANEOUS) ×3 IMPLANT
IRRIG SUCT STRYKERFLOW 2 WTIP (MISCELLANEOUS) ×3
IRRIGATION SUCT STRKRFLW 2 WTP (MISCELLANEOUS) ×1 IMPLANT
KIT PROCEDURE DA VINCI SI (MISCELLANEOUS)
KIT PROCEDURE DVNC SI (MISCELLANEOUS) IMPLANT
MANIPULATOR UTERINE 4.5 ZUMI (MISCELLANEOUS) ×3 IMPLANT
NDL SAFETY ECLIPSE 18X1.5 (NEEDLE) ×1 IMPLANT
NEEDLE HYPO 18GX1.5 SHARP (NEEDLE) ×2
NEEDLE SPNL 18GX3.5 QUINCKE PK (NEEDLE) ×3 IMPLANT
OBTURATOR OPTICAL STANDARD 8MM (TROCAR) ×2
OBTURATOR OPTICAL STND 8 DVNC (TROCAR) ×1
OBTURATOR OPTICALSTD 8 DVNC (TROCAR) ×1 IMPLANT
PACK ROBOT GYN CUSTOM WL (TRAY / TRAY PROCEDURE) ×3 IMPLANT
PAD POSITIONING PINK XL (MISCELLANEOUS) ×3 IMPLANT
POUCH SPECIMEN RETRIEVAL 10MM (ENDOMECHANICALS) IMPLANT
SEAL CANN UNIV 5-8 DVNC XI (MISCELLANEOUS) ×4 IMPLANT
SEAL XI 5MM-8MM UNIVERSAL (MISCELLANEOUS) ×8
SET TRI-LUMEN FLTR TB AIRSEAL (TUBING) ×3 IMPLANT
SOLUTION ELECTROLUBE (MISCELLANEOUS) ×3 IMPLANT
SPONGE LAP 18X18 X RAY DECT (DISPOSABLE) ×3 IMPLANT
SURGIFLO W/THROMBIN 8M KIT (HEMOSTASIS) IMPLANT
SUT VIC AB 0 CT1 27 (SUTURE)
SUT VIC AB 0 CT1 27XBRD ANTBC (SUTURE) IMPLANT
SYR 10ML LL (SYRINGE) ×3 IMPLANT
TOWEL OR NON WOVEN STRL DISP B (DISPOSABLE) ×3 IMPLANT
TRAP SPECIMEN MUCOUS 40CC (MISCELLANEOUS) IMPLANT
TRAY FOLEY W/METER SILVER 16FR (SET/KITS/TRAYS/PACK) ×3 IMPLANT
UNDERPAD 30X30 (UNDERPADS AND DIAPERS) ×3 IMPLANT
WATER STERILE IRR 1000ML POUR (IV SOLUTION) IMPLANT

## 2016-11-06 NOTE — Anesthesia Procedure Notes (Addendum)
Procedure Name: Intubation Date/Time: 11/06/2016 1:51 PM Performed by: Glory Buff Pre-anesthesia Checklist: Patient identified, Emergency Drugs available, Suction available and Patient being monitored Patient Re-evaluated:Patient Re-evaluated prior to induction Oxygen Delivery Method: Circle system utilized Preoxygenation: Pre-oxygenation with 100% oxygen Induction Type: IV induction Ventilation: Mask ventilation without difficulty Laryngoscope Size: Miller and 2 Grade View: Grade I Tube type: Oral Tube size: 7.0 mm Number of attempts: 1 Airway Equipment and Method: Stylet Placement Confirmation: ETT inserted through vocal cords under direct vision,  positive ETCO2 and breath sounds checked- equal and bilateral Secured at: 20 cm Tube secured with: Tape Dental Injury: Teeth and Oropharynx as per pre-operative assessment

## 2016-11-06 NOTE — Transfer of Care (Signed)
Immediate Anesthesia Transfer of Care Note  Patient: Alexandra Bridges  Procedure(s) Performed: Procedure(s): XI ROBOTIC ASSISTED TOTAL HYSTERECTOMY WITH BILATERAL SALPINGO OOPHORECTOMY, SENTINAL LYMPH NODE BIOPSY (Bilateral)  Patient Location: PACU  Anesthesia Type:General  Level of Consciousness: awake, alert  and oriented  Airway & Oxygen Therapy: Patient Spontanous Breathing and Patient connected to face mask oxygen  Post-op Assessment: Report given to RN and Post -op Vital signs reviewed and stable  Post vital signs: Reviewed and stable  Last Vitals:  Vitals:   11/06/16 1108  BP: 123/73  Pulse: 69  Resp: 16  Temp: 36.6 C    Last Pain:  Vitals:   11/06/16 1108  TempSrc: Oral      Patients Stated Pain Goal: 4 ([id number])  Complications: No apparent anesthesia complications

## 2016-11-06 NOTE — Op Note (Signed)
OPERATIVE NOTE 11/06/16  Surgeon: Donaciano Eva   Assistants: Dr Lahoma Crocker (an MD assistant was necessary for tissue manipulation, management of robotic instrumentation, retraction and positioning due to the complexity of the case and hospital policies).   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  Estimated Blood Loss:  <20cc      Total IV Fluids: 300 ml         Specimens: uterus, cervix, bilateral tubes and ovareis, right external iliac SLN, right presacral SLN, left obturator SLN, left external iliac SLN         Complications:  None; patient tolerated the procedure well.         Disposition: PACU - hemodynamically stable.  Procedure Details  The patient was seen in the Holding Room. The risks, benefits, complications, treatment options, and expected outcomes were discussed with the patient.  The patient concurred with the proposed plan, giving informed consent.  The site of surgery properly noted/marked. The patient was identified as Probation officer and the procedure verified as a Robotic-assisted hysterectomy with bilateral salpingo oophorectomy with SLN biopsy. A Time Out was held and the above information confirmed.  After induction of anesthesia, the patient was draped and prepped in the usual sterile manner. Pt was placed in supine position after anesthesia and draped and prepped in the usual sterile manner. The abdominal drape was placed after the CholoraPrep had been allowed to dry for 3 minutes.  Her arms were  tucked to her side with all appropriate precautions.  The shoulders were stabilized with padded shoulder blocks applied to the acromium processes.  The patient was placed in the semi-lithotomy position in [place].  The perineum was prepped with Betadine. The patient was then prepped. Foley catheter was placed.  A sterile speculum was placed in the vagina.  The cervix was grasped with a single-tooth tenaculum and dilated with Kennon Rounds dilators. 1mg  total of ICG was injected into the cervical stroma at 2 and 9 o'clock at a 62mm depth (concentration 0..5mg /ml).  The ZUMI uterine manipulator with a medium colpotomizer ring was placed without difficulty.  A pneum occluder balloon was placed over the manipulator.  OG tube placement was confirmed and to suction.   Next, a 5 mm skin incision was made 1 cm below the subcostal margin in the midclavicular line.  The 5 mm Optiview port and scope was used for direct entry.  Opening pressure was under 10 mm CO2.  The abdomen was insufflated and the findings were noted as above.   At this point and all points during the procedure, the patient's intra-abdominal pressure did not exceed 15 mmHg. Next, a 10 mm skin incision was made in the umbilicus and a right and left port was placed about 10 cm lateral to the robot port on the right and left side.  A fourth arm was placed in the left lower quadrant 2 cm above and superior and medial to the anterior superior iliac spine.  All ports were placed under direct visualization.  The patient was placed in steep Trendelenburg.  Bowel was folded away into the upper abdomen.  The robot was docked  in the normal manner.  The right and left peritoneum were opened parallel to the IP ligament to open the retroperitoneal spaces bilaterally. The SLN mapping was performed in bilateral pelvic basins. The para rectal and paravesical spaces were opened up. Lymphatic channels were identified travelling to the following visualized sentinel lymph  node's: right external iliac, right presacral, left obturator, left external iliac. These SLN's were separated from their surrounding lymphatic tissue, removed and sent for permanent pathology.  The hysterectomy was started after the round ligament on the right side was incised and the retroperitoneum was entered and the pararectal space was developed.  The ureter was noted to be on the medial leaf of the broad ligament.  The peritoneum above the ureter was incised and stretched and the infundibulopelvic ligament was skeletonized, cauterized and cut.  The posterior peritoneum was taken down to the level of the KOH ring.  The anterior peritoneum was also taken down.  The bladder flap was created to the level of the KOH ring.  The uterine artery on the right side was skeletonized, cauterized and cut in the normal manner.  A similar procedure was performed on the left.  The colpotomy was made and the uterus, cervix, bilateral ovaries and tubes were amputated and delivered through the vagina.  Pedicles were inspected and excellent hemostasis was achieved.    The colpotomy at the vaginal cuff was closed with Vicryl on a CT1 needle in an interrupted figure of 8 manner.  Irrigation was used and excellent hemostasis was achieved.  At this point in the procedure was completed.  Robotic instruments were removed under direct visulaization.  The robot was undocked. The 10 mm ports were closed with Vicryl on a UR-5 needle and the fascia was closed with 0 Vicryl on a UR-5 needle.  The skin was closed with 4-0 Vicryl in a subcuticular manner.  Dermabond was applied.  Sponge, lap and needle counts correct x 2.  The patient was taken to the recovery room in stable condition.  The vagina was swabbed with  minimal bleeding noted.   All instrument and needle counts were correct x  3.   The patient was transferred to the recovery room in a stable condition.  Donaciano Eva, MD

## 2016-11-06 NOTE — H&P (View-Only) (Signed)
Consult Note: Gyn-Onc  Alexandra Bridges 56 y.o. female  CC:  Chief Complaint  Patient presents with  . Complex endometrial hyperplasia with atypia    HPI: Patient is seen today in consultation at the request of Dr. Marylynn Pearson. Primary care physician Dr. Izora Gala.  Patient is a 56 year old gravida 3 para 3 who was seen by Dr. Julien Girt and underwent an ultrasound on 08/08/2016. It revealed the uterus to be 10.7 x 5.3 x 6.8 cm normal appearing adnexa. However, there was a thickened inhomogeneous myometrium with no definitive fibroids they raise the question of possible adenomyosis. There is a possible density seen within the endometrial cavity. She underwent a post saline ultrasound revealed a 19 x 9 mm polyp appearing mass seen on the posterior endometrial wall. On 10/09/2016 she underwent a hysteroscopy D&C. 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 She simply presented at [facility] on July 16 with complaints of heavy bleeding. She was placed on Megace and subsequently has been referred to [place].  Her bleeding has improved on the Megace. Her cycles prior to this were very regular. She moved from [place] and saw Dr. Julien Girt is a new patient in May. These bleeding irregularities started in January. She did have an [place], which is consistent with her being premenopausal. She's had 3 vaginal deliveries. Her mammogram is due in August. She had a colonoscopy gauge of 50 with follow-up in 10 years. Should a son who had a colonoscopy is 37 and had many polyps removed. Sig allergy to penicillin however she does not believe she had a penicillin allergy. She was taking penicillin developed a rash she was also having her kitchen remodeled and believes that most of that secondary to environmental exposures are not penicillin itself.  Review of Systems: Constitutional: Denies fever. Skin: No rash Cardiovascular: No chest pain,  shortness of breath, or edema  Gastro Intestinal: No nausea, vomiting, constipation, or diarrhea reported.  Genitourinary: No frequency, urgency, or dysuria. +vaginal bleeding and discharge.  Psychology: No complaints. Is somewhat worried about the timing of all this. She is a 42-year-old granddaughter and is expecting a new grandson on August 12. She also has a trip to [place] for September 10  Current Meds:  Outpatient Encounter Prescriptions as of 10/24/2016  Medication Sig  . cetirizine (ZYRTEC) 10 MG tablet Take 10 mg by mouth daily as needed for allergies.  . megestrol (MEGACE) 40 MG tablet Take 2 tablets (80 mg total) by mouth 2 (two) times daily.   No facility-administered encounter medications on file as of 10/24/2016.     Allergy:  Allergies  Allergen Reactions  . Penicillins Rash    Social Hx:   Social History   Social History  . Marital status: Married    Spouse name: N/A  . Number of children: N/A  . Years of education: N/A   Occupational History  . Not on file.   Social History Main Topics  . Smoking status: Never Smoker  . Smokeless tobacco: Never Used  . Alcohol use 1.2 oz/week    2 Glasses of wine per week  . Drug use: No  . Sexual activity: Yes   Other Topics Concern  . Not on file   Social History Narrative  . No narrative on file    Past Surgical Hx:  Past Surgical History:  Procedure Laterality Date  . TONSILLECTOMY      Past Medical Hx: History reviewed. No pertinent  past medical history.  Oncology Hx:   No history exists.    Family Hx:  Family History  Problem Relation Age of Onset  . Hypertension Mother   . Cancer Father        multiple myeloma  . Cancer Sister 78       endometrial and renal cell  . Hypertension Sister   . Cancer Brother 57       prostate  . Hypertension Brother   . Cancer Maternal Grandmother        pancreatic  . Cancer Maternal Grandfather        esophageal    Vitals:  Blood pressure (!) 113/54,  pulse 85, temperature 98.5 F (36.9 C), temperature source Oral, resp. rate 18, height 5' 7" (1.702 m), weight 164 lb 1.6 oz (74.4 kg), SpO2 100 %.  Physical Exam:  Well-nourished well-developed female in no acute distress.  Neck: Supple, no lymphadenopathy, no thyromegaly.  Lungs: Clear to auscultation bilaterally  Cardiac: Regular rate and rhythm  Abdomen: Soft, nontender, nondistended. There are no palpable mass or prostatomegaly.  Groins: No lymphadenopathy.  Extremities: No edema  Pelvic: External genitalia within normal limits. Vagina is slightly atrophic. There is evidence of menstrual flow. The cervix is multiparous. There are no gross visible lesions. Bimanual examination the uterus is mid plane. It is of normal size shape and consistency. There are no adnexal masses. It is freely mobile.  Assessment/Plan: 56 year old with complicated atypical hyperplasia. A detailed discussion was held with the patient and her husband with regard to to her diagnosis. We discussed the standard management options for CAH which includes surgery and depending on the results of surgery adjuvant therapy if there is a deeply invasive endometrial cancer. We discussed that while she has complex atypical hyperplasia and we currently do not know if she carries a diagnosis and surgery will be indicated to both the diagnosis and treat the issue.  The options for surgical management include a hysterectomy and removal of the tubes and ovaries possibly with removal of sentinel lymph nodes. A minimally invasive approach including a robotic hysterectomy or laparoscopic hysterectomy have benefits including shorter hospital stay, recovery time and better wound healing. The patient has been counseled about these surgical options and the risks of surgery in general including infection, bleeding, damage to surrounding structures (including bowel, bladder, ureters, nerves or vessels), and the postoperative risks of PE/  DVT, and lymphedema. I discussed positioning during surgery of trendelenberg and risks of minor facial swelling and care we take in preoperative positioning. After counseling and consideration of her options, she desires to proceed with robotic hysterectomy bilateral salpingo-oophorectomy and sentinel lymph node dissection as indicated.  She understands that if she does not map on her sentinel lymph node will proceed with a hysterectomy and sent it for frozen section. Only if there is deeply invasive cancer within proceed with a complete lymphadenectomy. Her sister had endometrial cancer and had 20 lymph nodes removed so she is familiar with this as well as the neuropathy in the genitofemoral region that her sister also did experience postoperatively.  Her family history is fairly worrisome. Her sister is going to undergo testing for Lynch syndrome. I discussed with the patient that if she has endometrial cancer on final pathology that I would recommend she undergo Lynch testing as well. However, if she does not have endometrial cancer I would test her tumor for mismatch repair proteins and MSI. That would then dictate the need for her to undergo  genetic testing. Obviously, if her sister is positive for Lynch syndrome the patient will undergo testing for the same mutation that her sister exhibits. This was explained in detail to the patient and her husband and they're very comfortable with this staged approach to genetic testing as they are with a staged approach 2 sentinel lymph nodes and only complete lymphadenectomy should frozen section revealed deeply invasive cancer.  The patient does have a grandchild that is expected on August 12 as well as a trip to [place] for September 10. She would like to schedule surgery for August 7 with Dr. Everitt Amber. She states that she does not have to be present for the birth of her grandchild that she would like to make the trip to [place] as planned with her friends.  I discussed with her that if we are able to accomplish in a minimally invasive fashion I feel that she would most likely be able to make this trip and I would recommend that she take a full aspirin during her long flights and for a few days after to decrease the risk of VTE. She will continue her Megace until the time of surgery to decrease the risk of heavy bleeding.  We appreciate the opportunity to partner in the care of this very pleasant patient.  Chandrea Zellman A., MD 10/24/2016, 2:48 PM

## 2016-11-06 NOTE — Discharge Instructions (Signed)
11/06/2016  Return to work: 4 weeks  Activity: 1. Be up and out of the bed during the day.  Take a nap if needed.  You may walk up steps but be careful and use the hand rail.  Stair climbing will tire you more than you think, you may need to stop part way and rest.   2. No lifting or straining for 6 weeks.  3. No driving for 1 weeks.  Do Not drive if you are taking narcotic pain medicine.  4. Shower daily.  Use soap and water on your incision and pat dry; don't rub.   5. No sexual activity and nothing in the vagina for 8 weeks.  Medications:  - Take ibuprofen and tylenol first line for pain control. Take these regularly (every 6 hours) to decrease the build up of pain.  - If necessary, for severe pain not relieved by ibuprofen, take percocet.  - While taking percocet you should take sennakot every night to reduce the likelihood of constipation. If this causes diarrhea, stop its use.  Diet: 1. Low sodium Heart Healthy Diet is recommended.  2. It is safe to use a laxative if you have difficulty moving your bowels.   Wound Care: 1. Keep clean and dry.  Shower daily.  Reasons to call the Doctor:   Fever - Oral temperature greater than 100.4 degrees Fahrenheit  Foul-smelling vaginal discharge  Difficulty urinating  Nausea and vomiting  Increased pain at the site of the incision that is unrelieved with pain medicine.  Difficulty breathing with or without chest pain  New calf pain especially if only on one side  Sudden, continuing increased vaginal bleeding with or without clots.   Follow-up: 1. See Everitt Amber in 3-4 weeks.  Contacts: For questions or concerns you should contact:  Dr. Everitt Amber at [phone] After hours and on week-ends call [phone] and ask to speak to the physician on call for Gynecologic Oncology

## 2016-11-06 NOTE — Anesthesia Preprocedure Evaluation (Addendum)
Anesthesia Evaluation  Patient identified by MRN, date of birth, ID band Patient awake    Reviewed: Allergy & Precautions, NPO status , Patient's Chart, lab work & pertinent test results  Airway Mallampati: II  TM Distance: >3 FB Neck ROM: Full    Dental  (+) Teeth Intact, Dental Advisory Given   Pulmonary neg pulmonary ROS,    Pulmonary exam normal breath sounds clear to auscultation       Cardiovascular Exercise Tolerance: Good negative cardio ROS Normal cardiovascular exam Rhythm:Regular Rate:Normal     Neuro/Psych PSYCHIATRIC DISORDERS Anxiety negative neurological ROS     GI/Hepatic negative GI ROS, Neg liver ROS,   Endo/Other  negative endocrine ROS  Renal/GU negative Renal ROS     Musculoskeletal negative musculoskeletal ROS (+)   Abdominal   Peds  Hematology negative hematology ROS (+)   Anesthesia Other Findings Day of surgery medications reviewed with the patient.  Reproductive/Obstetrics                            Anesthesia Physical Anesthesia Plan  ASA: II  Anesthesia Plan: General   Post-op Pain Management:    Induction: Intravenous  PONV Risk Score and Plan: 3 and Ondansetron, Dexamethasone, Midazolam and Scopolamine patch - Pre-op  Airway Management Planned: Oral ETT  Additional Equipment:   Intra-op Plan:   Post-operative Plan: Extubation in OR  Informed Consent: I have reviewed the patients History and Physical, chart, labs and discussed the procedure including the risks, benefits and alternatives for the proposed anesthesia with the patient or authorized representative who has indicated his/her understanding and acceptance.   Dental advisory given  Plan Discussed with: CRNA  Anesthesia Plan Comments: (Risks/benefits of general anesthesia discussed with patient including risk of damage to teeth, lips, gum, and tongue, nausea/vomiting, allergic reactions  to medications, and the possibility of heart attack, stroke and death.  All patient questions answered.  Patient wishes to proceed.)       Anesthesia Quick Evaluation

## 2016-11-06 NOTE — Interval H&P Note (Signed)
History and Physical Interval Note:  11/06/2016 1:11 PM  Alexandra Bridges  has presented today for surgery, with the diagnosis of complex endometrial hyperplasia  The various methods of treatment have been discussed with the patient and family. After consideration of risks, benefits and other options for treatment, the patient has consented to  Procedure(s): XI ROBOTIC ASSISTED TOTAL HYSTERECTOMY WITH BILATERAL SALPINGO OOPHORECTOMY, SENTINAL LYMPH NODE BIOPSY (Bilateral) as a surgical intervention .  The patient's history has been reviewed, patient examined, no change in status, stable for surgery.  I have reviewed the patient's chart and labs.  Questions were answered to the patient's satisfaction.     Donaciano Eva

## 2016-11-06 NOTE — Anesthesia Postprocedure Evaluation (Signed)
Anesthesia Post Note  Patient: Alexandra Bridges  Procedure(s) Performed: Procedure(s) (LRB): XI ROBOTIC ASSISTED TOTAL HYSTERECTOMY WITH BILATERAL SALPINGO OOPHORECTOMY, SENTINAL LYMPH NODE BIOPSY (Bilateral)     Patient location during evaluation: PACU Anesthesia Type: General Level of consciousness: awake and alert Pain management: pain level controlled Vital Signs Assessment: post-procedure vital signs reviewed and stable Respiratory status: spontaneous breathing, nonlabored ventilation, respiratory function stable and patient connected to nasal cannula oxygen Cardiovascular status: blood pressure returned to baseline and stable Postop Assessment: no signs of nausea or vomiting Anesthetic complications: no    Last Vitals:  Vitals:   11/06/16 1637 11/06/16 1645  BP:  115/66  Pulse: 72   Resp: 14   Temp:      Last Pain:  Vitals:   11/06/16 1637  TempSrc:   PainSc: 5                  Catalina Gravel

## 2016-11-07 ENCOUNTER — Telehealth: Payer: Self-pay | Admitting: *Deleted

## 2016-11-07 ENCOUNTER — Encounter (HOSPITAL_COMMUNITY): Payer: Self-pay | Admitting: Gynecologic Oncology

## 2016-11-07 DIAGNOSIS — N8502 Endometrial intraepithelial neoplasia [EIN]: Secondary | ICD-10-CM | POA: Diagnosis not present

## 2016-11-07 LAB — BASIC METABOLIC PANEL
ANION GAP: 8 (ref 5–15)
BUN: 8 mg/dL (ref 6–20)
CALCIUM: 9 mg/dL (ref 8.9–10.3)
CHLORIDE: 108 mmol/L (ref 101–111)
CO2: 22 mmol/L (ref 22–32)
CREATININE: 0.64 mg/dL (ref 0.44–1.00)
GFR calc Af Amer: >60 mL/min (ref >60)
GFR calc non Af Amer: >60 mL/min (ref >60)
GLUCOSE: 138 mg/dL — AB (ref 65–99)
Potassium: 4.2 mmol/L (ref 3.5–5.1)
Sodium: 138 mmol/L (ref 135–145)

## 2016-11-07 LAB — CBC
HEMATOCRIT: 34 % — AB (ref 36.0–46.0)
Hemoglobin: 11.9 g/dL — ABNORMAL LOW (ref 12.0–15.0)
MCH: 29.7 pg (ref 26.0–34.0)
MCHC: 35 g/dL (ref 30.0–36.0)
MCV: 84.8 fL (ref 78.0–100.0)
Platelets: 195 10*3/uL (ref 150–400)
RBC: 4.01 MIL/uL (ref 3.87–5.11)
RDW: 12.8 % (ref 11.5–15.5)
WBC: 8.8 10*3/uL (ref 4.0–10.5)

## 2016-11-07 MED ORDER — IBUPROFEN 600 MG PO TABS: 600.0000 mg | ORAL_TABLET | Freq: Four times a day (QID) | ORAL | 0 refills | Status: DC | PRN

## 2016-11-07 MED ORDER — SENNOSIDES-DOCUSATE SODIUM 8.6-50 MG PO TABS: 2.0000 | ORAL_TABLET | Freq: Every day | ORAL | 0 refills | Status: DC

## 2016-11-07 MED ORDER — OXYCODONE-ACETAMINOPHEN 5-325 MG PO TABS: 1.0000 | ORAL_TABLET | ORAL | 0 refills | Status: DC | PRN

## 2016-11-07 NOTE — Telephone Encounter (Signed)
Scheduled post op appt per staff message. Patient to receive appt with her discharge summary

## 2016-11-07 NOTE — Progress Notes (Signed)
Discharge instructions discussed with patient and family, verbalized agreement and understanding, prescription given to patient 

## 2016-11-09 ENCOUNTER — Ambulatory Visit: Payer: BC Managed Care – PPO

## 2016-11-12 ENCOUNTER — Telehealth: Payer: Self-pay | Admitting: Gynecologic Oncology

## 2016-11-12 NOTE — Telephone Encounter (Signed)
Post op telephone call to check patient status.  Patient describes expected post operative status.  Adequate PO intake reported.  Bowels and bladder functioning without difficulty.  Pain minimal.  Reportable signs and symptoms reviewed.  Final path discussed.  Follow up appt given.

## 2016-11-30 ENCOUNTER — Ambulatory Visit
Admission: RE | Admit: 2016-11-30 | Discharge: 2016-11-30 | Disposition: A | Discharge status: Home / Self Care | Payer: BC Managed Care – PPO | Source: Ambulatory Visit | Attending: Obstetrics and Gynecology | Admitting: Obstetrics and Gynecology

## 2016-11-30 ENCOUNTER — Encounter: Payer: Self-pay | Admitting: Radiology

## 2016-11-30 DIAGNOSIS — Z1231 Encounter for screening mammogram for malignant neoplasm of breast: Secondary | ICD-10-CM

## 2016-12-05 ENCOUNTER — Encounter: Payer: Self-pay | Admitting: Gynecologic Oncology

## 2016-12-05 ENCOUNTER — Ambulatory Visit: Payer: Self-pay | Attending: Gynecologic Oncology | Admitting: Gynecologic Oncology

## 2016-12-05 VITALS — BP 123/78 | HR 88 | Temp 98.5°F | Resp 20 | Wt 160.0 lb

## 2016-12-05 DIAGNOSIS — Z9889 Other specified postprocedural states: Secondary | ICD-10-CM | POA: Insufficient documentation

## 2016-12-05 DIAGNOSIS — Z4889 Encounter for other specified surgical aftercare: Secondary | ICD-10-CM | POA: Insufficient documentation

## 2016-12-05 DIAGNOSIS — N8502 Endometrial intraepithelial neoplasia [EIN]: Secondary | ICD-10-CM

## 2016-12-05 DIAGNOSIS — Z90710 Acquired absence of both cervix and uterus: Secondary | ICD-10-CM | POA: Insufficient documentation

## 2016-12-05 NOTE — Progress Notes (Signed)
Follow-up - postop Assessment:    56 y.o. year old with a history of CAH.   S/p robotic assisted total hysterectomy, BSO and SLN on 11/06/16. There was no residual CAH or cancer in her specimen.   Plan: 1) Pathology reports reviewed today 2) Treatment counseling - I discussed the benign nature of her pathology. No further follow-up is necessary for this She was given the opportunity to ask questions, which were answered to her satisfaction, and she is agreement with the above mentioned plan of care.  3)  Return to clinic annually to see her OBGYN, Dr Julien Girt.  HPI:  Alexandra Bridges is a 56 y.o. year old initially seen in consultation on 10/24/16 referred by Dr Julien Girt for [facility].  She then underwent a robotic total hysterectomy, BSO and SLN biopsy on 10/31/25 without complications.  Her postoperative course was uncomplicated.  Her final pathology revealed no residual CAH.  She is seen today for a postoperative check and to discuss her pathology results and ongoing plan.  Since discharge from the hospital, she is feeling well.  She has improving appetite, normal bowel and bladder function, and pain controlled with minimal PO medication. She has no other complaints today.    Review of systems: Constitutional:  She has no weight gain or weight loss. She has no fever or chills. Eyes: No blurred vision Ears, Nose, Mouth, Throat: No dizziness, headaches or changes in hearing. No mouth sores. Cardiovascular: No chest pain, palpitations or edema. Respiratory:  No shortness of breath, wheezing or cough Gastrointestinal: She has normal bowel movements without diarrhea or constipation. She denies any nausea or vomiting. She denies blood in her stool or heart burn. Genitourinary:  She denies pelvic pain, pelvic pressure or changes in her urinary function. She has no hematuria, dysuria, or incontinence. She has no irregular vaginal bleeding or vaginal discharge Musculoskeletal: Denies muscle weakness or joint pains.   Skin:  She has no skin changes, rashes or itching Neurological:  Denies dizziness or headaches. No neuropathy, no numbness or tingling. Psychiatric:  She denies depression or anxiety. Hematologic/Lymphatic:   No easy bruising or bleeding   Physical Exam: Blood pressure 123/78, pulse 88, temperature 98.5 F (36.9 C), temperature source Oral, resp. rate 20, weight 160 lb (72.6 kg), SpO2 99 %. General: Well dressed, well nourished in no apparent distress.   HEENT:  Normocephalic and atraumatic, no lesions.  Extraocular muscles intact. Sclerae anicteric. Pupils equal, round, reactive. No mouth sores or ulcers. Thyroid is normal size, not nodular, midline. Skin:  No lesions or rashes. Breasts:  Soft, symmetric.  No skin or nipple changes.  No palpable LN or masses. Lungs:  Clear to auscultation bilaterally.  No wheezes. Cardiovascular:  Regular rate and rhythm.  No murmurs or rubs. Abdomen:  Soft, nontender, nondistended.  No palpable masses.  No hepatosplenomegaly.  No ascites. Normal bowel sounds.  No hernias.  Incisions are well healed Genitourinary: Normal EGBUS  Vaginal cuff intact.  No bleeding or discharge.  No cul de sac fullness. Extremities: No cyanosis, clubbing or edema.  No calf tenderness or erythema. No palpable cords. Psychiatric: Mood and affect are appropriate. Neurological: Awake, alert and oriented x 3. Sensation is intact, no neuropathy.  Musculoskeletal: No pain, normal strength and range of motion.  Donaciano Eva, MD

## 2016-12-05 NOTE — Patient Instructions (Signed)
Plan to follow up with Dr. Julien Girt.  Please call for any questions or concerns.

## 2017-01-10 ENCOUNTER — Encounter: Payer: Self-pay | Admitting: Family Medicine

## 2017-01-10 ENCOUNTER — Encounter: Payer: Self-pay | Admitting: General Practice

## 2017-01-10 ENCOUNTER — Ambulatory Visit (INDEPENDENT_AMBULATORY_CARE_PROVIDER_SITE_OTHER): Payer: Managed Care, Other (non HMO) | Admitting: Family Medicine

## 2017-01-10 VITALS — BP 116/78 | HR 83 | Temp 98.3°F | Resp 16 | Ht 67.0 in | Wt 162.2 lb

## 2017-01-10 DIAGNOSIS — Z23 Encounter for immunization: Secondary | ICD-10-CM | POA: Diagnosis not present

## 2017-01-10 DIAGNOSIS — E785 Hyperlipidemia, unspecified: Secondary | ICD-10-CM

## 2017-01-10 DIAGNOSIS — Z0000 Encounter for general adult medical examination without abnormal findings: Secondary | ICD-10-CM

## 2017-01-10 NOTE — Progress Notes (Signed)
   Subjective:    Patient ID: Alexandra Bridges, female    DOB: 1961-01-12, 56 y.o.   MRN: [id number]  HPI New to establish.  Previous MD- Laurann Montana  Hyperlipidemia- pt has been concerned that lipids have been increasing over the last 3 yrs.  Last LDL 126.  Exercising regularly.  CPE- UTD on colonoscopy (done at age 59), UTD on mammo, s/p hysterectomy.   Review of Systems Patient reports no vision/ hearing changes, adenopathy,fever, weight change,  persistant/recurrent hoarseness , swallowing issues, chest pain, palpitations, edema, persistant/recurrent cough, hemoptysis, dyspnea (rest/exertional/paroxysmal nocturnal), gastrointestinal bleeding (melena, rectal bleeding), abdominal pain, significant heartburn, bowel changes, GU symptoms (dysuria, hematuria, incontinence), Gyn symptoms (abnormal  bleeding, pain),  syncope, focal weakness, memory loss, numbness & tingling, skin/hair/nail changes, abnormal bruising or bleeding, anxiety, or depression.     Objective:   Physical Exam General Appearance:    Alert, cooperative, no distress, appears stated age  Head:    Normocephalic, without obvious abnormality, atraumatic  Eyes:    PERRL, conjunctiva/corneas clear, EOM's intact, fundi    benign, both eyes  Ears:    Normal TM's and external ear canals, both ears  Nose:   Nares normal, septum midline, mucosa normal, no drainage    or sinus tenderness  Throat:   Lips, mucosa, and tongue normal; teeth and gums normal  Neck:   Supple, symmetrical, trachea midline, no adenopathy;    Thyroid: no enlargement/tenderness/nodules  Back:     Symmetric, no curvature, ROM normal, no CVA tenderness  Lungs:     Clear to auscultation bilaterally, respirations unlabored  Chest Wall:    No tenderness or deformity   Heart:    Regular rate and rhythm, S1 and S2 normal, no murmur, rub   or gallop  Breast Exam:    Deferred to GYN  Abdomen:     Soft, non-tender, bowel sounds active all four quadrants,    no masses, no  organomegaly  Genitalia:    Deferred to GYN  Rectal:    Extremities:   Extremities normal, atraumatic, no cyanosis or edema  Pulses:   2+ and symmetric all extremities  Skin:   Skin color, texture, turgor normal, no rashes or lesions  Lymph nodes:   Cervical, supraclavicular, and axillary nodes normal  Neurologic:   CNII-XII intact, normal strength, sensation and reflexes    throughout          Assessment & Plan:

## 2017-01-10 NOTE — Assessment & Plan Note (Signed)
Pt's PE WNL.  UTD on colonoscopy, mammo.  No need for pap s/p hysterectomy.  Check labs.  Anticipatory guidance provided.

## 2017-01-10 NOTE — Addendum Note (Signed)
Addended by: Katina Dung on: 01/10/2017 03:23 PM   Modules accepted: Orders

## 2017-01-10 NOTE — Patient Instructions (Signed)
Follow up in 1 year or as needed We'll notify you of your lab results and make any changes if needed Continue to work on healthy diet and regular exercise- you look great! Call with any questions or concerns Welcome!  We're glad to have you!  

## 2017-01-14 LAB — BASIC METABOLIC PANEL
BUN: 10 mg/dL (ref 7–25)
CHLORIDE: 98 mmol/L (ref 98–110)
CO2: 27 mmol/L (ref 20–32)
Calcium: 9.7 mg/dL (ref 8.6–10.4)
Creat: 0.81 mg/dL (ref 0.50–1.05)
Glucose, Bld: 96 mg/dL (ref 65–99)
POTASSIUM: 4.1 mmol/L (ref 3.5–5.3)
SODIUM: 135 mmol/L (ref 135–146)

## 2017-01-14 LAB — LIPID PANEL
CHOL/HDL RATIO: 3.5 (calc) (ref <5.0)
CHOLESTEROL: 202 mg/dL — AB (ref <200)
HDL: 58 mg/dL (ref >50)
LDL CHOLESTEROL (CALC): 120 mg/dL — AB
Non-HDL Cholesterol (Calc): 144 mg/dL (calc) — ABNORMAL HIGH (ref <130)
Triglycerides: 128 mg/dL (ref <150)

## 2017-01-14 LAB — TEST AUTHORIZATION

## 2017-01-14 LAB — CBC
HEMATOCRIT: 40.3 % (ref 35.0–45.0)
HEMOGLOBIN: 13.5 g/dL (ref 11.7–15.5)
MCH: 29.5 pg (ref 27.0–33.0)
MCHC: 33.5 g/dL (ref 32.0–36.0)
MCV: 88 fL (ref 80.0–100.0)
MPV: 10.2 fL (ref 7.5–12.5)
Platelets: 280 10*3/uL (ref 140–400)
RBC: 4.58 10*6/uL (ref 3.80–5.10)
RDW: 12.1 % (ref 11.0–15.0)
WBC: 8.6 10*3/uL (ref 3.8–10.8)

## 2017-01-14 LAB — HEPATIC FUNCTION PANEL
AG Ratio: 1.8 (calc) (ref 1.0–2.5)
ALKALINE PHOSPHATASE (APISO): 65 U/L (ref 33–130)
ALT: 11 U/L (ref 6–29)
AST: 15 U/L (ref 10–35)
Albumin: 4.6 g/dL (ref 3.6–5.1)
BILIRUBIN TOTAL: 0.9 mg/dL (ref 0.2–1.2)
Bilirubin, Direct: 0.1 mg/dL (ref 0.0–0.2)
Globulin: 2.6 g/dL (calc) (ref 1.9–3.7)
Indirect Bilirubin: 0.8 mg/dL (calc) (ref 0.2–1.2)
Total Protein: 7.2 g/dL (ref 6.1–8.1)

## 2017-01-14 LAB — TSH: TSH: 0.65 mIU/L (ref 0.40–4.50)

## 2017-02-22 ENCOUNTER — Ambulatory Visit: Payer: Self-pay | Admitting: *Deleted

## 2017-02-22 NOTE — Telephone Encounter (Signed)
   Reason for Disposition . All other urine symptoms  Answer Assessment - Initial Assessment Questions 1. SYMPTOM: "What's the main symptom you're concerned about?" (e.g., frequency, incontinence)     Blood on tissue when wipes 2. ONSET: "When did the  ________  start?"     Wed.Thur 3. PAIN: "Is there any pain?" If so, ask: "How bad is it?" (Scale: 1-10; mild, moderate, severe)     No pain 4. CAUSE: "What do you think is causing the symptoms?"     Possible UTI 5. OTHER SYMPTOMS: "Do you have any other symptoms?" (e.g., fever, flank pain, blood in urine, pain with urination)     no 6. PREGNANCY: "Is there any chance you are pregnant?" "When was your last menstrual period?"     n/a    After reviewing everything with patient- she has had hysterectomy in past- she last intercourse was Monday previous. She does report some discomfort with intercourse. Sourse of blood is not clear per patient.  Protocols used: URINARY [facility]

## 2017-02-25 ENCOUNTER — Ambulatory Visit (INDEPENDENT_AMBULATORY_CARE_PROVIDER_SITE_OTHER): Payer: Managed Care, Other (non HMO) | Admitting: Family Medicine

## 2017-02-25 ENCOUNTER — Encounter: Payer: Self-pay | Admitting: Family Medicine

## 2017-02-25 ENCOUNTER — Other Ambulatory Visit: Payer: Self-pay

## 2017-02-25 VITALS — BP 114/80 | HR 79 | Temp 98.1°F | Resp 16 | Ht 67.0 in | Wt 164.1 lb

## 2017-02-25 DIAGNOSIS — R82998 Other abnormal findings in urine: Secondary | ICD-10-CM | POA: Diagnosis not present

## 2017-02-25 DIAGNOSIS — R310 Gross hematuria: Secondary | ICD-10-CM | POA: Diagnosis not present

## 2017-02-25 DIAGNOSIS — R399 Unspecified symptoms and signs involving the genitourinary system: Secondary | ICD-10-CM | POA: Diagnosis not present

## 2017-02-25 LAB — POCT URINALYSIS DIPSTICK
BILIRUBIN UA: NEGATIVE
Blood, UA: NEGATIVE
Glucose, UA: NEGATIVE
KETONES UA: NEGATIVE
Nitrite, UA: NEGATIVE
PH UA: 6 (ref 5.0–8.0)
PROTEIN UA: NEGATIVE
SPEC GRAV UA: 1.015 (ref 1.010–1.025)
Urobilinogen, UA: 0.2 E.U./dL

## 2017-02-25 MED ORDER — CEPHALEXIN 500 MG PO CAPS: 500.0000 mg | ORAL_CAPSULE | Freq: Two times a day (BID) | ORAL | 0 refills | Status: AC

## 2017-02-25 NOTE — Patient Instructions (Signed)
Follow up as needed or as scheduled We'll notify you of your urine culture and make any changes if needed Start the Keflex (Cephalexin) twice daily x5 days.  Take w/ food Drink plenty of fluids If the bleeding returns- let us know! Call with any questions or concerns Happy Holidays!

## 2017-02-25 NOTE — Progress Notes (Signed)
   Subjective:    Patient ID: Alexandra Bridges, female    DOB: [birth year], 56 y.o.   MRN: [id number]  HPI Hematuria- pt noted blood in urine on Wednesday and Thursday.  Wednesday only noted on toilet tissue.  Wore a panty liner on Thursday and did see 'a couple drops' of blood.  No pain.  3 days prior had intercourse which was painful.  + frequency of urination.  + urgency but that is 'all the time'.  No suprapubic pain/pressure.  No CVA tenderness.   Review of Systems For ROS see HPI     Objective:   Physical Exam  Constitutional: She is oriented to person, place, and time. She appears well-developed and well-nourished. No distress.  Abdominal: Soft. She exhibits no distension. There is tenderness (mild suprapubic TTP but no CVA tenderness ).  Genitourinary:  Genitourinary Comments: Pt deferred  Neurological: She is alert and oriented to person, place, and time.  Skin: Skin is warm and dry.  Psychiatric: She has a normal mood and affect. Her behavior is normal. Thought content normal.  Vitals reviewed.         Assessment & Plan:  Gross hematuria- pt had 24 hrs of this last week but this has since resolved.  No pain to suspect a kidney stone.  Her painful intercourse 3 days prior would not have caused pink or red blood- it would have been brown or dark.  Given her suprapubic pressure, will treat as UTI.  Reviewed supportive care and red flags that should prompt return.  Pt expressed understanding and is in agreement w/ plan.

## 2017-02-25 NOTE — Telephone Encounter (Signed)
Patient being seen 11/26 at 11:30 by PCP

## 2017-02-28 LAB — URINE CULTURE
MICRO NUMBER: 81325331
SPECIMEN QUALITY: ADEQUATE

## 2017-04-01 ENCOUNTER — Ambulatory Visit: Payer: Self-pay | Admitting: *Deleted

## 2017-04-01 ENCOUNTER — Telehealth: Payer: Self-pay | Admitting: *Deleted

## 2017-04-01 NOTE — Telephone Encounter (Signed)
Pt treated for UTI over Thanksgiving. Had hematuria at that time; finished 5 day course of Keflex.  Recurrent hematuria started Saturday 03/30/17. This am "only on tissue with wiping." Reports increased frequency and urgency, mild pressure at bladder area. Appt made for today with Elyn Aquas PAC.  Reason for Disposition . Blood in urine  (Exception: could be normal menstrual bleeding)  Answer Assessment - Initial Assessment Questions 1. COLOR of URINE: "Describe the color of the urine."  (e.g., tea-colored, pink, red, blood clots, bloody)     Looks "normal" 2. ONSET: "When did the bleeding start?"      03/30/17. Had UTI "over Thanksgiving and finished course of Keflex over 5 days. 3. EPISODES: "How many times has there been blood in the urine?" or "How many times today?"     1 day, Saturday. "Just on tissue paper this am when I wipe." Pt has had hysterectomy. 4. PAIN with URINATION: "Is there any pain with passing your urine?" If so, ask: "How bad is the pain?"  (Scale 1-10; or mild, moderate, severe)    - MILD - complains slightly about urination hurting    - MODERATE - interferes with normal activities      - SEVERE - excruciating, unwilling or unable to urinate because of the pain      Mild 5. FEVER: "Do you have a fever?" If so, ask: "What is your temperature, how was it measured, and when did it start?"     NO 6. ASSOCIATED SYMPTOMS: "Are you passing urine more frequently than usual?"     Yes, urgency and frequency reported. 7. OTHER SYMPTOMS: "Do you have any other symptoms?" (e.g., back/flank pain, abdominal pain, vomiting)     Mild pressure at bladder.  Protocols used: URINE - BLOOD IN-A-AH

## 2017-04-01 NOTE — Telephone Encounter (Signed)
Patient called and scheduled appt to have her vaginal cuff check. Per her GYN she has bleeding at the site.

## 2017-04-03 ENCOUNTER — Encounter: Payer: Self-pay | Admitting: Gynecologic Oncology

## 2017-04-03 ENCOUNTER — Ambulatory Visit: Payer: Managed Care, Other (non HMO) | Attending: Gynecologic Oncology | Admitting: Gynecologic Oncology

## 2017-04-03 ENCOUNTER — Other Ambulatory Visit: Payer: Self-pay | Admitting: Gynecologic Oncology

## 2017-04-03 ENCOUNTER — Ambulatory Visit: Payer: Managed Care, Other (non HMO) | Admitting: Physician Assistant

## 2017-04-03 VITALS — BP 136/68 | HR 78 | Temp 97.7°F | Resp 20 | Ht 67.0 in | Wt 164.9 lb

## 2017-04-03 DIAGNOSIS — N8501 Benign endometrial hyperplasia: Secondary | ICD-10-CM | POA: Diagnosis present

## 2017-04-03 DIAGNOSIS — N939 Abnormal uterine and vaginal bleeding, unspecified: Secondary | ICD-10-CM

## 2017-04-03 DIAGNOSIS — Z90710 Acquired absence of both cervix and uterus: Secondary | ICD-10-CM | POA: Diagnosis not present

## 2017-04-03 DIAGNOSIS — Z9889 Other specified postprocedural states: Secondary | ICD-10-CM | POA: Insufficient documentation

## 2017-04-03 MED ORDER — ESTROGENS, CONJUGATED 0.625 MG/GM VA CREA: 1.0000 | TOPICAL_CREAM | VAGINAL | 3 refills | Status: DC

## 2017-04-03 NOTE — Progress Notes (Signed)
Follow-up - postop Assessment:    57 y.o. year old with a history of CAH.   S/p robotic assisted total hysterectomy, BSO and SLN on 11/06/16. There was no residual CAH or cancer in her specimen. She has a very small approximately 1-2 mm vaginal cuff separation on the right apex of the vagina.   Plan: 1) She will use Premarin vaginal cream 1 g in the vagina 3 times weekly. 2) She will return to see [place] in 4 weeks for a vaginal cuff check.  HPI:  Alexandra Bridges is a 57 y.o. year old initially seen in consultation on 10/24/16 referred by Dr Julien Girt for [facility].  She then underwent a robotic total hysterectomy, BSO and SLN biopsy on 12/08/55 without complications.  Her postoperative course was uncomplicated.  Her final pathology revealed no residual CAH.  She was seen by Dr. Denman George for her postoperative check and September. She states about 7 weeks after surgery she had an episode of spotting. Then she did well until developing skin holidays where she had some bleeding but it was felt that it was most likely related to UTI type symptoms. She continued having some bleeding with wiping and contact her primary care physician who recommended that she see her OB/GYN. She was seen in her OB/GYN's office and they notice some vaginal bleeding and she was asked to see [place] today. She's otherwise been doing fairly well. She and her husband had attempted intercourse but she does have some apical discomfort. She otherwise is been doing well from a postoperative perspective and has been exercising and doing yoga and feeling well. The bleeding is somewhat distressing obviously.  Physical Exam: Blood pressure 136/68, pulse 78, temperature 97.7 F (36.5 C), temperature source Oral, resp. rate 20, height 5\' 7"  (1.702 m), weight 164 lb 14.4 oz (74.8 kg), SpO2 99 %. General: Well dressed, well nourished in no apparent distress.    Abdomen:  Soft, nontender, nondistended.  No palpable masses.  No hepatosplenomegaly.  No ascites. Normal  bowel sounds.  No hernias.  Incisions are well healed Genitourinary: Normal EGBUS.  There is a small amount of dark blood in the vaginal vault that was wiped away. On speculum examination there is a 1-2 mm separation in the right apex of the vaginal cuff. There is no tenderness. There is no granulation tissue. There is no area to apply silver nitrate 2. There is no active bleeding the bleeding is dark. On bimanual examination there is no fullness, there is no fluctuance.  Nancy Marus A., MD

## 2017-04-03 NOTE — Patient Instructions (Signed)
Plan on using premarin vaginal cream three times a week in the vagina.  Plan on following up in four weeks or sooner if needed.  Please call for any questions or concerns.

## 2017-05-01 ENCOUNTER — Encounter: Payer: Self-pay | Admitting: Gynecologic Oncology

## 2017-05-01 ENCOUNTER — Inpatient Hospital Stay: Payer: Managed Care, Other (non HMO) | Attending: Gynecologic Oncology | Admitting: Gynecologic Oncology

## 2017-05-01 VITALS — BP 119/80 | HR 78 | Temp 97.8°F | Resp 18 | Ht 67.0 in | Wt 163.6 lb

## 2017-05-01 DIAGNOSIS — Z90722 Acquired absence of ovaries, bilateral: Secondary | ICD-10-CM | POA: Insufficient documentation

## 2017-05-01 DIAGNOSIS — N8502 Endometrial intraepithelial neoplasia [EIN]: Secondary | ICD-10-CM | POA: Diagnosis present

## 2017-05-01 DIAGNOSIS — T8131XD Disruption of external operation (surgical) wound, not elsewhere classified, subsequent encounter: Secondary | ICD-10-CM | POA: Insufficient documentation

## 2017-05-01 DIAGNOSIS — Y838 Other surgical procedures as the cause of abnormal reaction of the patient, or of later complication, without mention of misadventure at the time of the procedure: Secondary | ICD-10-CM | POA: Diagnosis not present

## 2017-05-01 DIAGNOSIS — Z90710 Acquired absence of both cervix and uterus: Secondary | ICD-10-CM

## 2017-05-01 NOTE — Progress Notes (Signed)
Follow-up - postop Assessment:    57 y.o. year old with a history of CAH.   S/p robotic assisted total hysterectomy, BSO and SLN on 11/06/16. There was no residual CAH or cancer in her specimen. History of small vaginal cuff separation on the right apex of the vagina without dehiscence. Now resolved   Plan: Can discontinue estrogen. No additional scheduled follow-up necessary.   HPI:  Alexandra Bridges is a 57 y.o. year old initially seen in consultation on 10/24/16 referred by Dr Julien Girt for [facility].  She then underwent a robotic total hysterectomy, BSO and SLN biopsy on 04/02/92 without complications.  Her postoperative course was uncomplicated.  Her final pathology revealed no residual CAH.  She was seen for her postoperative check and September. Then 7 weeks after surgery she had an episode of spotting. Then she did well until developing skin holidays where she had some bleeding but it was felt that it was most likely related to UTI type symptoms. She continued having some bleeding with wiping and contact her primary care physician who recommended that she see her OB/GYN. She was seen in her OB/GYN's office and they notice some vaginal bleeding and she was asked to see [place] today. She's otherwise been doing fairly well. She and her husband had attempted intercourse but she does have some apical discomfort. She otherwise is been doing well from a postoperative perspective and has been exercising and doing yoga and feeling well. The bleeding is somewhat distressing obviously.  Physical Exam: Blood pressure 119/80, pulse 78, temperature 97.8 F (36.6 C), temperature source Oral, resp. rate 18, height 5\' 7"  (1.702 m), weight 163 lb 9.6 oz (74.2 kg), SpO2 100 %. General: Well dressed, well nourished in no apparent distress.    Abdomen:  Soft, nontender, nondistended.  No palpable masses.  No hepatosplenomegaly.  No ascites. Normal bowel sounds.  No hernias.  Incisions are well healed Genitourinary: Normal EGBUS.   Vaginal mucosa in tact with no bleeding, separation or lesions.   Donaciano Eva, MD

## 2017-05-01 NOTE — Patient Instructions (Signed)
It is safe to resume intercourse. If you have pain or dryness with intercourse you can continue to use vaginal estrogen 3 times per week, otherwise you can stop this.  Contact Dr Denman George if you have questions at [phone]. Please follow up with Dr Julien Girt for routine annual gynecologic care.

## 2017-10-28 ENCOUNTER — Other Ambulatory Visit: Payer: Self-pay | Admitting: Obstetrics and Gynecology

## 2017-10-28 DIAGNOSIS — Z1231 Encounter for screening mammogram for malignant neoplasm of breast: Secondary | ICD-10-CM

## 2017-12-03 ENCOUNTER — Ambulatory Visit: Payer: Managed Care, Other (non HMO)

## 2017-12-04 ENCOUNTER — Ambulatory Visit
Admission: RE | Admit: 2017-12-04 | Discharge: 2017-12-04 | Disposition: A | Discharge status: Home / Self Care | Payer: Managed Care, Other (non HMO) | Source: Ambulatory Visit | Attending: Obstetrics and Gynecology | Admitting: Obstetrics and Gynecology

## 2017-12-04 DIAGNOSIS — Z1231 Encounter for screening mammogram for malignant neoplasm of breast: Secondary | ICD-10-CM

## 2018-01-14 ENCOUNTER — Ambulatory Visit (INDEPENDENT_AMBULATORY_CARE_PROVIDER_SITE_OTHER): Payer: Managed Care, Other (non HMO) | Admitting: Family Medicine

## 2018-01-14 ENCOUNTER — Encounter: Payer: Self-pay | Admitting: Family Medicine

## 2018-01-14 ENCOUNTER — Other Ambulatory Visit: Payer: Self-pay

## 2018-01-14 VITALS — BP 110/70 | HR 74 | Temp 98.1°F | Resp 14 | Ht 67.0 in | Wt 164.0 lb

## 2018-01-14 DIAGNOSIS — Z23 Encounter for immunization: Secondary | ICD-10-CM | POA: Diagnosis not present

## 2018-01-14 DIAGNOSIS — Z0000 Encounter for general adult medical examination without abnormal findings: Secondary | ICD-10-CM | POA: Diagnosis not present

## 2018-01-14 DIAGNOSIS — Z780 Asymptomatic menopausal state: Secondary | ICD-10-CM | POA: Diagnosis not present

## 2018-01-14 DIAGNOSIS — E785 Hyperlipidemia, unspecified: Secondary | ICD-10-CM

## 2018-01-14 LAB — HEPATIC FUNCTION PANEL
ALBUMIN: 4.6 g/dL (ref 3.5–5.2)
ALT: 16 U/L (ref 0–35)
AST: 14 U/L (ref 0–37)
Alkaline Phosphatase: 88 U/L (ref 39–117)
BILIRUBIN TOTAL: 0.8 mg/dL (ref 0.2–1.2)
Bilirubin, Direct: 0.1 mg/dL (ref 0.0–0.3)
Total Protein: 7.1 g/dL (ref 6.0–8.3)

## 2018-01-14 LAB — BASIC METABOLIC PANEL
BUN: 21 mg/dL (ref 6–23)
CALCIUM: 10 mg/dL (ref 8.4–10.5)
CO2: 29 mEq/L (ref 19–32)
Chloride: 102 mEq/L (ref 96–112)
Creatinine, Ser: 0.69 mg/dL (ref 0.40–1.20)
GFR: 93.14 mL/min (ref >60.00)
GLUCOSE: 105 mg/dL — AB (ref 70–99)
POTASSIUM: 4.2 meq/L (ref 3.5–5.1)
SODIUM: 137 meq/L (ref 135–145)

## 2018-01-14 LAB — CBC WITH DIFFERENTIAL/PLATELET
BASOS ABS: 0 10*3/uL (ref 0.0–0.1)
Basophils Relative: 0.9 % (ref 0.0–3.0)
EOS PCT: 2.6 % (ref 0.0–5.0)
Eosinophils Absolute: 0.1 10*3/uL (ref 0.0–0.7)
HEMATOCRIT: 42.9 % (ref 36.0–46.0)
Hemoglobin: 14.5 g/dL (ref 12.0–15.0)
LYMPHS PCT: 39.9 % (ref 12.0–46.0)
Lymphs Abs: 2.1 10*3/uL (ref 0.7–4.0)
MCHC: 33.8 g/dL (ref 30.0–36.0)
MCV: 88.4 fl (ref 78.0–100.0)
MONOS PCT: 7.7 % (ref 3.0–12.0)
Monocytes Absolute: 0.4 10*3/uL (ref 0.1–1.0)
Neutro Abs: 2.6 10*3/uL (ref 1.4–7.7)
Neutrophils Relative %: 48.9 % (ref 43.0–77.0)
Platelets: 258 10*3/uL (ref 150.0–400.0)
RBC: 4.86 Mil/uL (ref 3.87–5.11)
RDW: 13.4 % (ref 11.5–15.5)
WBC: 5.3 10*3/uL (ref 4.0–10.5)

## 2018-01-14 LAB — TSH: TSH: 1.28 u[IU]/mL (ref 0.35–4.50)

## 2018-01-14 LAB — LIPID PANEL
CHOL/HDL RATIO: 3
CHOLESTEROL: 206 mg/dL — AB (ref 0–200)
HDL: 60.5 mg/dL (ref >39.00)
LDL CALC: 123 mg/dL — AB (ref 0–99)
NonHDL: 145.09
TRIGLYCERIDES: 110 mg/dL (ref 0.0–149.0)
VLDL: 22 mg/dL (ref 0.0–40.0)

## 2018-01-14 NOTE — Assessment & Plan Note (Signed)
Pt's PE WNL.  UTD on mammo, colonoscopy, Tdap.  Flu shot given today.  DEXA ordered.  Check labs.  Anticipatory guidance provided.

## 2018-01-14 NOTE — Assessment & Plan Note (Signed)
Chronic problem.  Controlling w/ diet and exercise.  Check labs.  Adjust meds prn

## 2018-01-14 NOTE — Patient Instructions (Signed)
Follow up in 1 year or as needed We'll notify you of your lab results and make any changes if needed Continue to work on healthy diet and regular exercise- you look great! Call with any questions or concerns Happy Fall!!!

## 2018-01-14 NOTE — Progress Notes (Signed)
   Subjective:    Patient ID: Alexandra Bridges, female    DOB: [birth year], 57 y.o.   MRN: [id number]  HPI CPE- UTD on mammo, colonoscopy.  No need for paps due to TAH-BSO.  UTD on Tdap, due for flu.   Review of Systems Patient reports no vision/ hearing changes, adenopathy,fever, weight change,  persistant/recurrent hoarseness , swallowing issues, chest pain, palpitations, edema, persistant/recurrent cough, hemoptysis, dyspnea (rest/exertional/paroxysmal nocturnal), gastrointestinal bleeding (melena, rectal bleeding), abdominal pain, significant heartburn, bowel changes, GU symptoms (dysuria, hematuria, incontinence), Gyn symptoms (abnormal  bleeding, pain),  syncope, focal weakness, memory loss, numbness & tingling, skin/hair/nail changes, abnormal bruising or bleeding, anxiety, or depression.     Objective:   Physical Exam General Appearance:    Alert, cooperative, no distress, appears stated age  Head:    Normocephalic, without obvious abnormality, atraumatic  Eyes:    PERRL, conjunctiva/corneas clear, EOM's intact, fundi    benign, both eyes  Ears:    Normal TM's and external ear canals, both ears  Nose:   Nares normal, septum midline, mucosa normal, no drainage    or sinus tenderness  Throat:   Lips, mucosa, and tongue normal; teeth and gums normal  Neck:   Supple, symmetrical, trachea midline, no adenopathy;    Thyroid: no enlargement/tenderness/nodules  Back:     Symmetric, no curvature, ROM normal, no CVA tenderness  Lungs:     Clear to auscultation bilaterally, respirations unlabored  Chest Wall:    No tenderness or deformity   Heart:    Regular rate and rhythm, S1 and S2 normal, no murmur, rub   or gallop  Breast Exam:    Deferred to GYN  Abdomen:     Soft, non-tender, bowel sounds active all four quadrants,    no masses, no organomegaly  Genitalia:    Deferred to GYN  Rectal:    Extremities:   Extremities normal, atraumatic, no cyanosis or edema  Pulses:   2+ and symmetric all  extremities  Skin:   Skin color, texture, turgor normal, no rashes or lesions  Lymph nodes:   Cervical, supraclavicular, and axillary nodes normal  Neurologic:   CNII-XII intact, normal strength, sensation and reflexes    throughout          Assessment & Plan:

## 2018-01-14 NOTE — Addendum Note (Signed)
Addended by: Midge Minium on: 01/14/2018 08:31 AM   Modules accepted: Orders

## 2018-01-15 ENCOUNTER — Ambulatory Visit (INDEPENDENT_AMBULATORY_CARE_PROVIDER_SITE_OTHER)
Admission: RE | Admit: 2018-01-15 | Discharge: 2018-01-15 | Disposition: A | Discharge status: Home / Self Care | Payer: Managed Care, Other (non HMO) | Source: Ambulatory Visit

## 2018-01-15 ENCOUNTER — Encounter: Payer: Self-pay | Admitting: General Practice

## 2018-01-15 DIAGNOSIS — Z780 Asymptomatic menopausal state: Secondary | ICD-10-CM

## 2018-06-17 ENCOUNTER — Encounter: Payer: Self-pay | Admitting: Physician Assistant

## 2018-06-17 ENCOUNTER — Other Ambulatory Visit: Payer: Self-pay

## 2018-06-17 ENCOUNTER — Ambulatory Visit (INDEPENDENT_AMBULATORY_CARE_PROVIDER_SITE_OTHER): Payer: Managed Care, Other (non HMO)

## 2018-06-17 ENCOUNTER — Ambulatory Visit: Payer: Self-pay

## 2018-06-17 ENCOUNTER — Ambulatory Visit (INDEPENDENT_AMBULATORY_CARE_PROVIDER_SITE_OTHER): Payer: Managed Care, Other (non HMO) | Admitting: Physician Assistant

## 2018-06-17 VITALS — BP 110/70 | HR 87 | Temp 98.0°F | Resp 14 | Ht 67.0 in | Wt 166.0 lb

## 2018-06-17 DIAGNOSIS — M25521 Pain in right elbow: Secondary | ICD-10-CM

## 2018-06-17 DIAGNOSIS — R0781 Pleurodynia: Secondary | ICD-10-CM
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
## 2018-06-17 NOTE — Patient Instructions (Signed)
Please go to the [facility] office for x-ray. We will call you with your results and alter treatment accordingly.   Alexandra Bridges  [place], [place] [address]  Please avoid heavy lifting or overexertion. Alternate tylenol and ibuprofen if needed for rib pain and elbow pain. Keep the arm elevated while resting. Can apply topical Icy Hot or Aspercreme.  This seems more likely a strain of muscle tendon during fall and mild muscle inflammation. If not improving though, we will need to get imaging.
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
## 2018-06-24 ENCOUNTER — Telehealth: Payer: Self-pay | Admitting: Physician Assistant

## 2018-06-24 DIAGNOSIS — M25521 Pain in right elbow: Secondary | ICD-10-CM

## 2018-06-24 NOTE — Telephone Encounter (Signed)
Pt states that she saw you on 3/17 and is still having pain in her arm. Pt was wanting to know if she could get an xray hopefully tomorrow due to being in the process of moving,

## 2018-06-24 NOTE — Telephone Encounter (Signed)
Ok to place order for R elbow and forearm, will have to check with [facility] to see if they will allow imaging at their site. Ok to place as Today so this can be pushed through giving injury as cause of symptoms

## 2018-06-25 ENCOUNTER — Telehealth: Payer: Self-pay | Admitting: Emergency Medicine

## 2018-06-25 ENCOUNTER — Ambulatory Visit (INDEPENDENT_AMBULATORY_CARE_PROVIDER_SITE_OTHER): Payer: Managed Care, Other (non HMO)

## 2018-06-25 DIAGNOSIS — M25521 Pain in right elbow: Secondary | ICD-10-CM | POA: Diagnosis not present

## 2018-06-25 NOTE — Addendum Note (Signed)
Addended by: Leonidas Romberg on: 06/25/2018 08:46 AM   Modules accepted: Orders

## 2018-06-25 NOTE — Telephone Encounter (Signed)
Patient was advised of results. See result note for documentation  Copied from [place] [id number]. Topic: General - Other >> Jun 25, 2018 11:00 AM Marin Olp L wrote: Reason for CRM: Patient calling for x-ray results

## 2018-06-25 NOTE — Telephone Encounter (Signed)
Patient arrived to the office inquiring about xray orders. Patient advised the orders will be placed and working on getting patient into the [place] location. Xray's orders placed

## 2019-01-16 ENCOUNTER — Encounter: Payer: Managed Care, Other (non HMO) | Admitting: Family Medicine

## 2019-06-19 IMAGING — DX RIGHT ELBOW - COMPLETE 3+ VIEW
4 series · 4 of 4 positions shown · non-contrast
Comparison: None.

CLINICAL DATA: Pain following injury

EXAM:
RIGHT ELBOW - COMPLETE 3+ VIEW

[elbow ap]
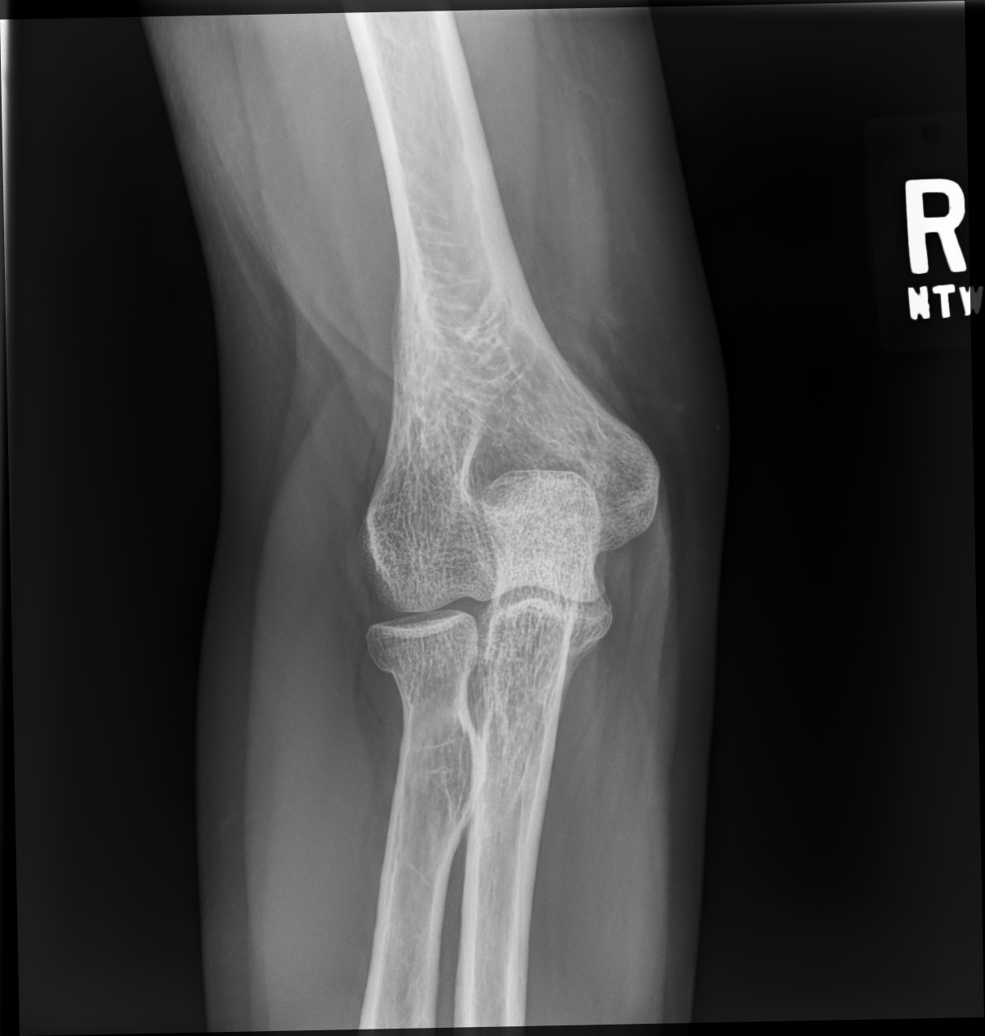

[elbow medial oblique]
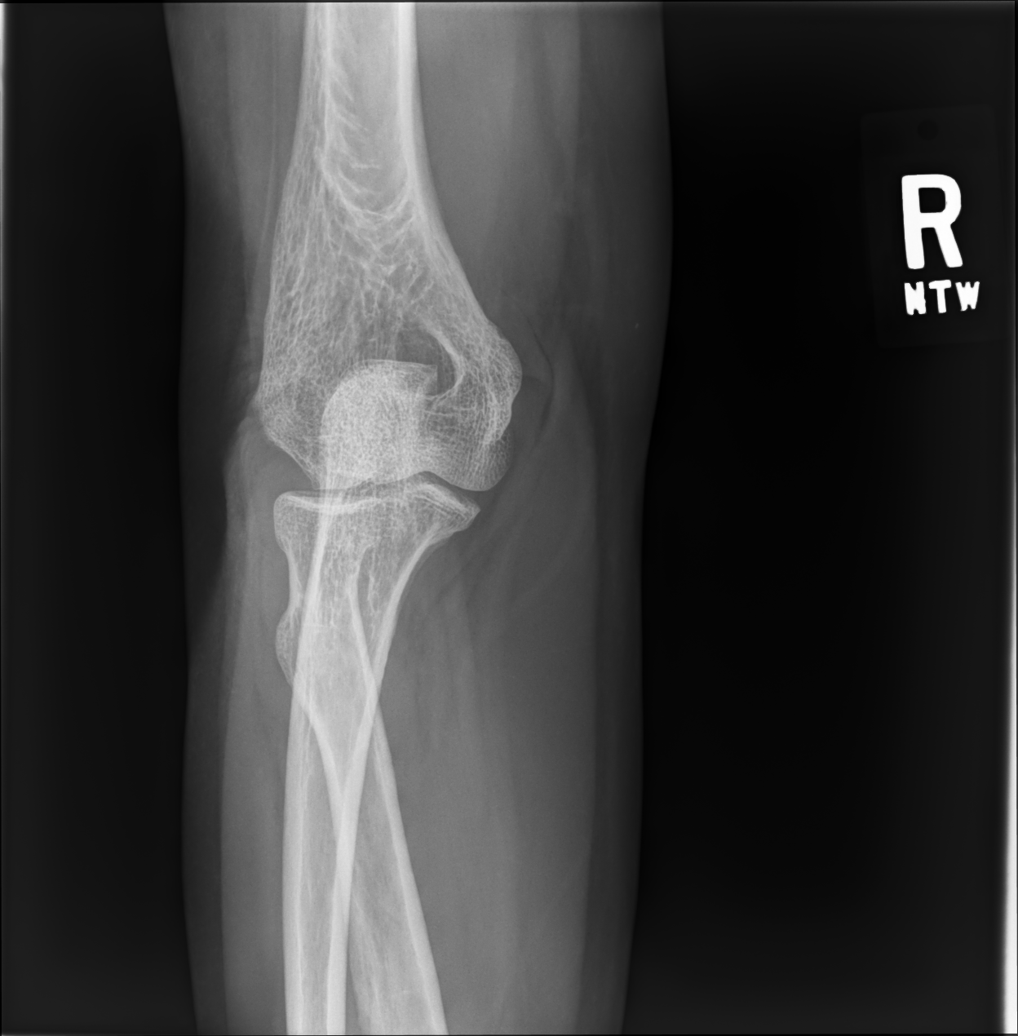

[elbow lateral oblique]
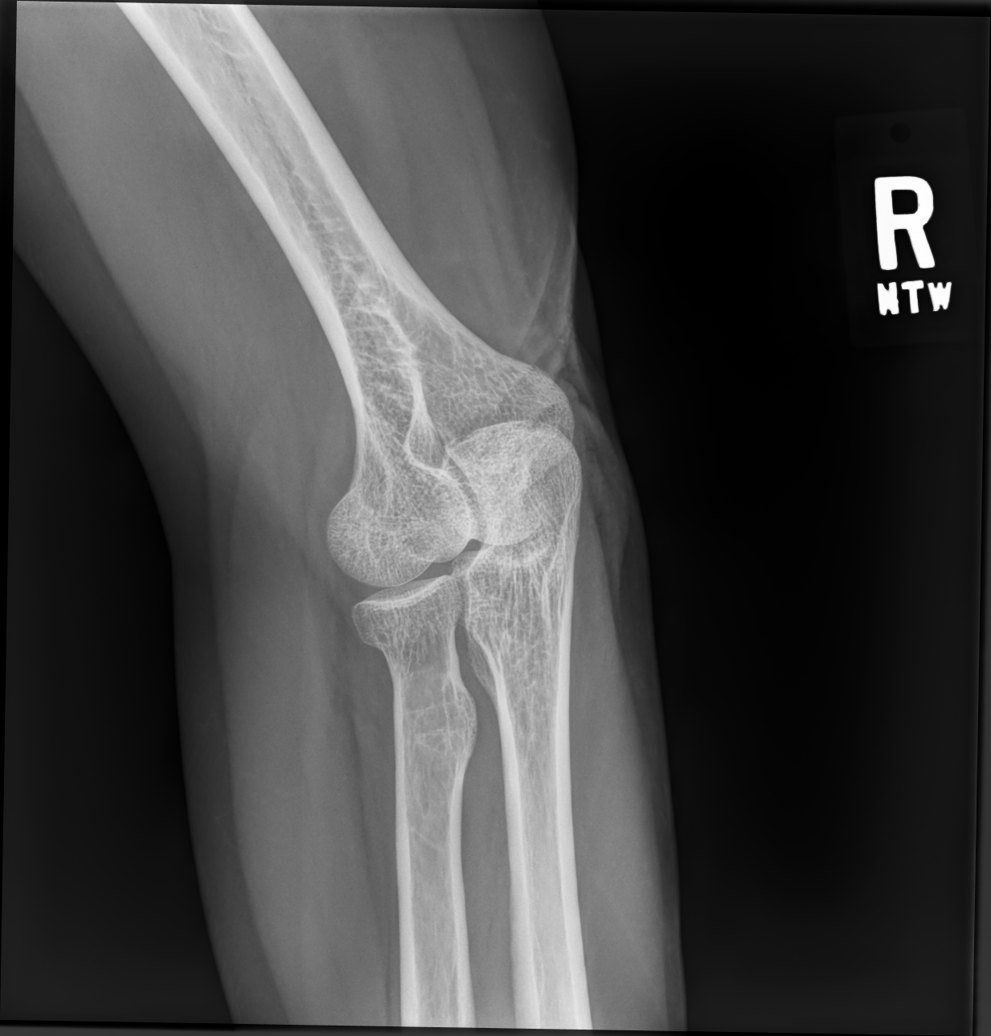

[elbow lat]
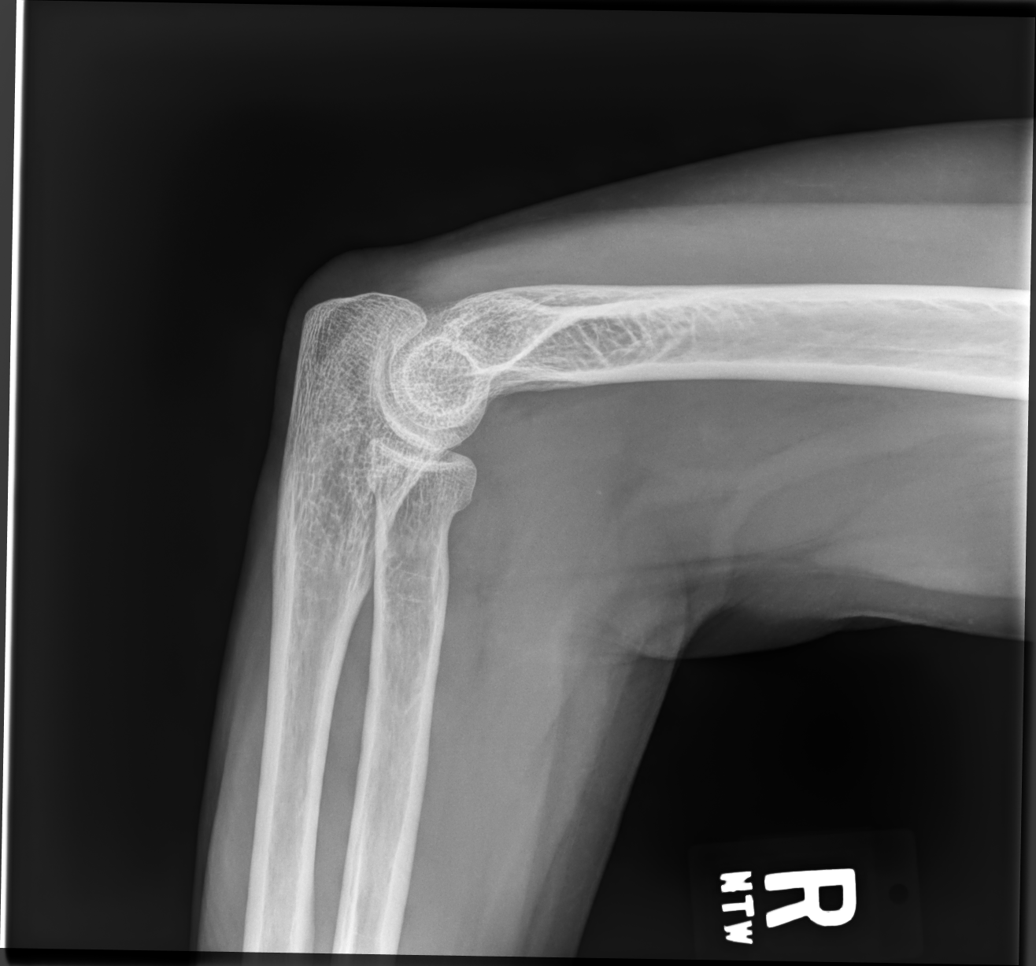

[4 of 4 positions shown; findings below may reference images not displayed]

FINDINGS: Frontal, lateral, and bilateral oblique views were obtained. There
is no appreciable fracture or dislocation. No appreciable joint
effusion. There is no appreciable joint space narrowing or erosion.
IMPRESSION: No fracture or dislocation.  No appreciable arthropathy.

## 2020-09-28 ENCOUNTER — Encounter: Payer: Self-pay | Admitting: *Deleted
